# Patient Record
Sex: Female | Born: 1955 | Race: White | Hispanic: No | Marital: Married | Smoking: Never smoker
Health system: Southern US, Community
[De-identification: ages and names within clinical notes are randomized; demographics above are authoritative.]

## PROBLEM LIST (undated history)

## (undated) DIAGNOSIS — F419 Anxiety disorder, unspecified: Secondary | ICD-10-CM

## (undated) DIAGNOSIS — C801 Malignant (primary) neoplasm, unspecified: Secondary | ICD-10-CM

## (undated) DIAGNOSIS — E785 Hyperlipidemia, unspecified: Secondary | ICD-10-CM

## (undated) DIAGNOSIS — E559 Vitamin D deficiency, unspecified: Secondary | ICD-10-CM

## (undated) DIAGNOSIS — E039 Hypothyroidism, unspecified: Secondary | ICD-10-CM

## (undated) DIAGNOSIS — F32A Depression, unspecified: Secondary | ICD-10-CM

## (undated) HISTORY — DX: Vitamin D deficiency, unspecified: E55.9

## (undated) HISTORY — DX: Hypothyroidism, unspecified: E03.9

## (undated) HISTORY — DX: Depression, unspecified: F32.A

## (undated) HISTORY — DX: Hyperlipidemia, unspecified: E78.5

## (undated) HISTORY — DX: Anxiety disorder, unspecified: F41.9

---

## 1982-10-11 HISTORY — PX: GYNECOLOGIC CRYOSURGERY: SHX857

## 1992-10-11 HISTORY — PX: REDUCTION MAMMAPLASTY: SUR839

## 2002-10-11 HISTORY — PX: LUMBAR DISC SURGERY: SHX700

## 2007-10-12 HISTORY — PX: THYROIDECTOMY: SHX17

## 2008-01-17 ENCOUNTER — Ambulatory Visit: Payer: Self-pay | Admitting: Otolaryngology

## 2008-02-07 ENCOUNTER — Ambulatory Visit: Payer: Self-pay | Admitting: Otolaryngology

## 2008-02-12 ENCOUNTER — Ambulatory Visit: Payer: Self-pay | Admitting: Otolaryngology

## 2008-03-04 ENCOUNTER — Ambulatory Visit: Payer: Self-pay | Admitting: Otolaryngology

## 2008-04-08 ENCOUNTER — Ambulatory Visit: Payer: Self-pay | Admitting: Otolaryngology

## 2008-04-16 ENCOUNTER — Ambulatory Visit: Payer: Self-pay | Admitting: Otolaryngology

## 2009-06-24 ENCOUNTER — Ambulatory Visit: Payer: Self-pay | Admitting: Family Medicine

## 2009-06-30 ENCOUNTER — Ambulatory Visit: Payer: Self-pay | Admitting: Family Medicine

## 2009-07-17 ENCOUNTER — Ambulatory Visit: Payer: Self-pay | Admitting: Surgery

## 2009-10-04 IMAGING — NM NUCLEAR MEDICINE 24 HOUR THYROID UPTAKE
5 series · 9 of 9 positions shown · non-contrast
Comparison: none

REASON FOR EXAM: Thyroid nodule
COMMENTS:

PROCEDURE:     NM  - NM THYROID WB 72 HR [DATE] [DATE]
RESULT:     The patient was given a dose of a 1.4 mCi of A-P2P. Anterior and
posterior whole body images are obtained at 24, 48 and 72 hours following
administration.

[Series 1000: (id) statics · 2.40mm/px · 2 of 2 frames shown (1 of 2)]
[frame 1/2  full-range]
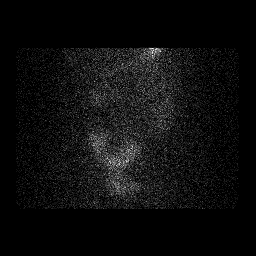
[frame 2/2  full-range]
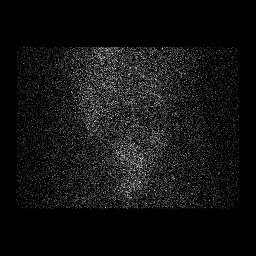

[Series 1000: (id) wb 24 hr · 2.40mm/px · 2 of 2 frames shown]
[frame 1/2  full-range]
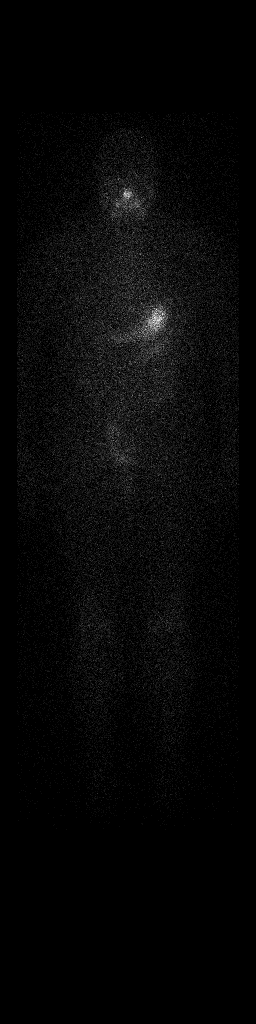
[frame 2/2  full-range]
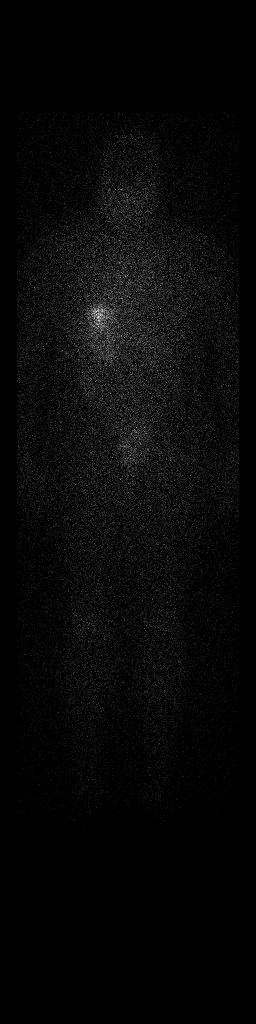

[Series 1000: (id) wb 72 hr · 2.40mm/px · 2 of 2 frames shown]
[frame 1/2]
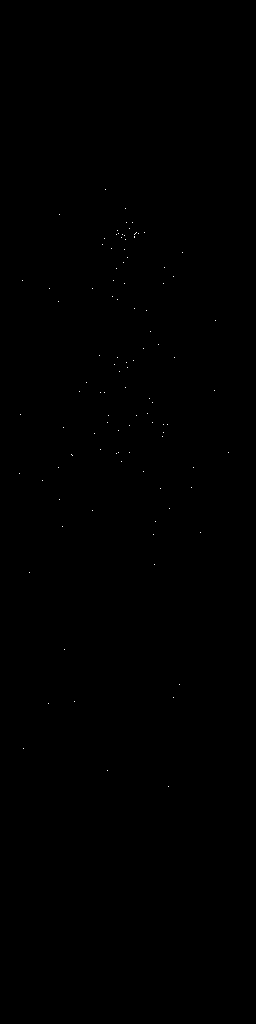
[frame 2/2]
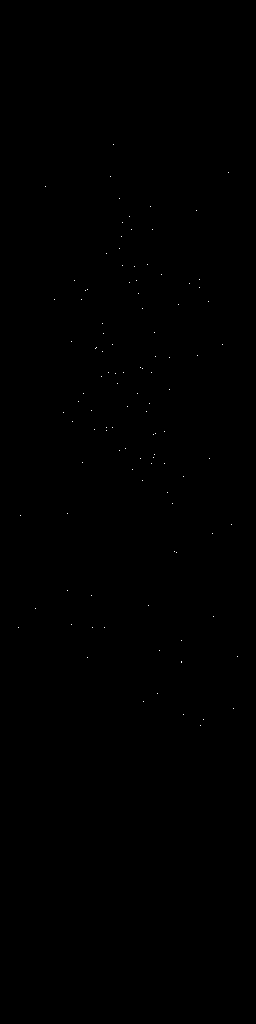

[Series 1000: (id) wb 48hr · 2.40mm/px · 2 of 2 frames shown]
[frame 1/2]
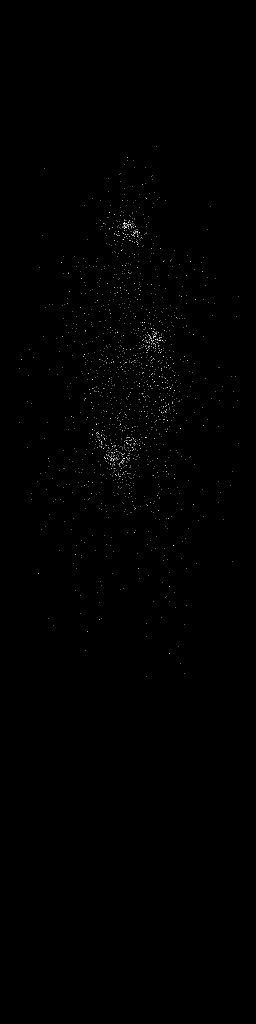
[frame 2/2]
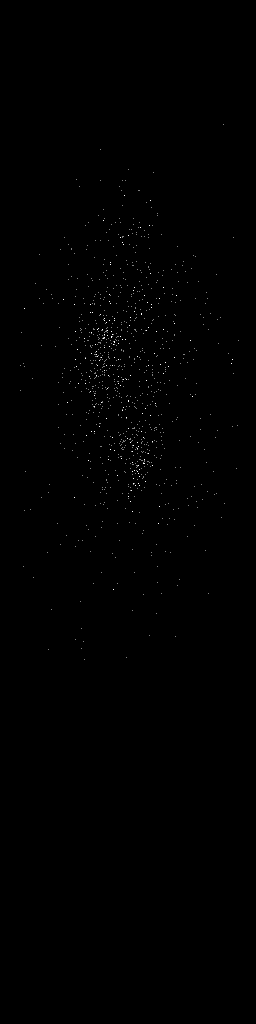

[Series 1000: (id) statics · 2.40mm/px · 1 of 1 slices shown (2 of 2)]
[im 1/1  full-range]
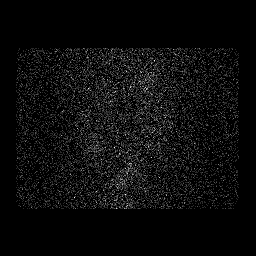

[9 of 9 positions shown; findings below may reference images not displayed]

FINDINGS: There is some minimal residual localization in the cervical region
over the thyroid bed with no significant abnormal localization otherwise.
Normal gastrointestinal and genitourinary activity is seen. There is no
evidence of metastatic disease.
IMPRESSION: Regional uptake in the area of the thyroid bed which may
represent some residual tissue. No definite metastatic disease demonstrated.

## 2009-10-11 HISTORY — PX: UMBILICAL HERNIA REPAIR: SHX196

## 2009-10-22 ENCOUNTER — Ambulatory Visit: Payer: Self-pay | Admitting: Family Medicine

## 2011-01-26 ENCOUNTER — Ambulatory Visit: Payer: Self-pay

## 2011-12-27 ENCOUNTER — Ambulatory Visit: Payer: Self-pay | Admitting: Anesthesiology

## 2011-12-27 LAB — HEMOGLOBIN: HGB: 11.5 g/dL — ABNORMAL LOW (ref 12.0–16.0)

## 2011-12-27 LAB — POTASSIUM: Potassium: 3.4 mmol/L — ABNORMAL LOW (ref 3.5–5.1)

## 2011-12-30 ENCOUNTER — Ambulatory Visit: Payer: Self-pay | Admitting: Surgery

## 2011-12-30 LAB — POTASSIUM: Potassium: 3.7 mmol/L (ref 3.5–5.1)

## 2013-06-04 DIAGNOSIS — E785 Hyperlipidemia, unspecified: Secondary | ICD-10-CM | POA: Insufficient documentation

## 2013-06-04 DIAGNOSIS — C73 Malignant neoplasm of thyroid gland: Secondary | ICD-10-CM | POA: Insufficient documentation

## 2013-06-04 DIAGNOSIS — I10 Essential (primary) hypertension: Secondary | ICD-10-CM | POA: Insufficient documentation

## 2013-06-04 DIAGNOSIS — G4733 Obstructive sleep apnea (adult) (pediatric): Secondary | ICD-10-CM | POA: Insufficient documentation

## 2013-06-04 DIAGNOSIS — F411 Generalized anxiety disorder: Secondary | ICD-10-CM | POA: Insufficient documentation

## 2013-06-04 DIAGNOSIS — F32A Depression, unspecified: Secondary | ICD-10-CM | POA: Insufficient documentation

## 2013-06-04 DIAGNOSIS — J309 Allergic rhinitis, unspecified: Secondary | ICD-10-CM | POA: Insufficient documentation

## 2013-08-01 ENCOUNTER — Ambulatory Visit: Payer: Self-pay

## 2013-10-26 DIAGNOSIS — E559 Vitamin D deficiency, unspecified: Secondary | ICD-10-CM | POA: Insufficient documentation

## 2015-02-02 NOTE — Op Note (Signed)
PATIENT NAME:  Marissa Sullivan, Marissa Sullivan MR#:  [id number] DATE OF BIRTH:  Jul 12, 1956  DATE OF PROCEDURE:  12/30/2011  PREOPERATIVE DIAGNOSIS: Umbilical hernia.   POSTOPERATIVE DIAGNOSIS: Umbilical hernia.   PROCEDURE: Umbilical hernia repair.   SURGEON: Loreli Dollar, MD   ANESTHESIA: General.   INDICATIONS: This 59 year old came in with complaint of bulging at the navel with mild discomfort very slowly enlarging. She had findings of an umbilical hernia in that there was a bulge of approximately 3 cm, was not reducible. Repair was recommended for definitive treatment.   DESCRIPTION OF PROCEDURE: The patient was placed on the operating table in the supine position under general anesthesia. The inner aspect of the umbilicus was cleaned with ChloraPrep with cotton swabs and also the surrounding abdomen cleaned as well and draped in a sterile manner.   There was a palpable mass within the umbilicus which was the hernia. A supraumbilical transversely oriented curvilinear incision was made some 3 cm in length, subsequently lengthened to 4 cm. Dissection was carried down through subcutaneous tissues to encounter a fluid filled sac which was about 3 cm in dimension. This was dissected free from surrounding structures and eventually clear fluid came out of it. It was further dissected down to the fascial ring defect which was a small fascial ring defect of about 5 mm. This sac was dissected free from the skin of the umbilicus and dissected free from the fascial defect. It was amputated with electrocautery. The sac was submitted in formalin for routine pathology. The wound was inspected. A number of small bleeding points were cauterized. Hemostasis was subsequently intact. Next, the fascia was repaired with a 0 Surgilon figure-of-eight suture and then another suture was placed on top of that which was a horizontal mattress suture. Next, the skin of the umbilicus was sutured to the deep fascia with interrupted 4-0  chromic. Next, the skin was closed with running 4-0 chromic subcuticular suture and Dermabond.   The patient tolerated surgery satisfactorily and was then prepared for transfer to the recovery room.   ____________________________ Lenna Sciara. Rochel Brome, MD [email or web address] D: 12/30/2011 08:31:55 ET T: 12/30/2011 08:46:05 ET JOB#: [id number]  cc: Loreli Dollar, MD, <Dictator> Loreli Dollar MD ELECTRONICALLY SIGNED 12/31/2011 19:02

## 2015-02-14 ENCOUNTER — Other Ambulatory Visit: Payer: Self-pay | Admitting: Family Medicine

## 2015-02-14 DIAGNOSIS — Z1239 Encounter for other screening for malignant neoplasm of breast: Secondary | ICD-10-CM

## 2015-09-03 ENCOUNTER — Ambulatory Visit
Admission: RE | Admit: 2015-09-03 | Discharge: 2015-09-03 | Disposition: A | Discharge status: Home / Self Care | Payer: BC Managed Care – PPO | Source: Ambulatory Visit | Attending: Family Medicine | Admitting: Family Medicine

## 2015-09-03 DIAGNOSIS — Z1239 Encounter for other screening for malignant neoplasm of breast: Secondary | ICD-10-CM

## 2015-09-03 DIAGNOSIS — Z1231 Encounter for screening mammogram for malignant neoplasm of breast: Secondary | ICD-10-CM | POA: Diagnosis not present

## 2015-09-03 HISTORY — DX: Malignant (primary) neoplasm, unspecified: C80.1

## 2015-09-09 ENCOUNTER — Other Ambulatory Visit: Payer: Self-pay | Admitting: Family Medicine

## 2015-09-09 DIAGNOSIS — R928 Other abnormal and inconclusive findings on diagnostic imaging of breast: Secondary | ICD-10-CM

## 2015-09-12 ENCOUNTER — Ambulatory Visit
Admission: RE | Admit: 2015-09-12 | Discharge: 2015-09-12 | Disposition: A | Discharge status: Home / Self Care | Payer: BC Managed Care – PPO | Source: Ambulatory Visit | Attending: Family Medicine | Admitting: Family Medicine

## 2015-09-12 DIAGNOSIS — R928 Other abnormal and inconclusive findings on diagnostic imaging of breast: Secondary | ICD-10-CM

## 2015-09-12 DIAGNOSIS — N63 Unspecified lump in breast: Secondary | ICD-10-CM | POA: Insufficient documentation

## 2016-01-13 ENCOUNTER — Other Ambulatory Visit: Payer: Self-pay | Admitting: Family Medicine

## 2016-01-13 DIAGNOSIS — M7989 Other specified soft tissue disorders: Secondary | ICD-10-CM

## 2016-01-19 ENCOUNTER — Ambulatory Visit
Admission: RE | Admit: 2016-01-19 | Discharge: 2016-01-19 | Disposition: A | Discharge status: Home / Self Care | Payer: BC Managed Care – PPO | Source: Ambulatory Visit | Attending: Family Medicine | Admitting: Family Medicine

## 2016-01-19 DIAGNOSIS — R2241 Localized swelling, mass and lump, right lower limb: Secondary | ICD-10-CM | POA: Diagnosis present

## 2016-01-19 DIAGNOSIS — M7989 Other specified soft tissue disorders: Secondary | ICD-10-CM

## 2017-02-11 DIAGNOSIS — Z800 Family history of malignant neoplasm of digestive organs: Secondary | ICD-10-CM | POA: Insufficient documentation

## 2017-02-11 DIAGNOSIS — N6310 Unspecified lump in the right breast, unspecified quadrant: Secondary | ICD-10-CM | POA: Insufficient documentation

## 2017-02-14 ENCOUNTER — Other Ambulatory Visit: Payer: Self-pay | Admitting: Family Medicine

## 2017-02-14 DIAGNOSIS — Z79899 Other long term (current) drug therapy: Secondary | ICD-10-CM

## 2017-02-14 DIAGNOSIS — N6310 Unspecified lump in the right breast, unspecified quadrant: Secondary | ICD-10-CM

## 2017-02-14 DIAGNOSIS — Z780 Asymptomatic menopausal state: Secondary | ICD-10-CM

## 2017-03-15 ENCOUNTER — Other Ambulatory Visit: Payer: Self-pay | Admitting: Family Medicine

## 2017-03-15 DIAGNOSIS — N6310 Unspecified lump in the right breast, unspecified quadrant: Secondary | ICD-10-CM

## 2017-04-22 ENCOUNTER — Ambulatory Visit
Admission: RE | Admit: 2017-04-22 | Discharge: 2017-04-22 | Disposition: A | Discharge status: Home / Self Care | Payer: BC Managed Care – PPO | Source: Ambulatory Visit | Attending: Family Medicine | Admitting: Family Medicine

## 2017-04-22 DIAGNOSIS — N6310 Unspecified lump in the right breast, unspecified quadrant: Secondary | ICD-10-CM | POA: Insufficient documentation

## 2018-05-22 ENCOUNTER — Other Ambulatory Visit: Payer: Self-pay | Admitting: Family Medicine

## 2018-05-22 DIAGNOSIS — Z1231 Encounter for screening mammogram for malignant neoplasm of breast: Secondary | ICD-10-CM

## 2018-05-30 ENCOUNTER — Ambulatory Visit
Admission: RE | Admit: 2018-05-30 | Discharge: 2018-05-30 | Disposition: A | Discharge status: Home / Self Care | Payer: BC Managed Care – PPO | Source: Ambulatory Visit | Attending: Family Medicine | Admitting: Family Medicine

## 2018-05-30 DIAGNOSIS — Z1231 Encounter for screening mammogram for malignant neoplasm of breast: Secondary | ICD-10-CM | POA: Diagnosis not present

## 2019-06-14 ENCOUNTER — Other Ambulatory Visit: Payer: Self-pay | Admitting: Family Medicine

## 2019-06-14 DIAGNOSIS — Z1231 Encounter for screening mammogram for malignant neoplasm of breast: Secondary | ICD-10-CM

## 2019-07-02 ENCOUNTER — Encounter (INDEPENDENT_AMBULATORY_CARE_PROVIDER_SITE_OTHER): Payer: Self-pay

## 2019-07-02 ENCOUNTER — Ambulatory Visit
Admission: RE | Admit: 2019-07-02 | Discharge: 2019-07-02 | Disposition: A | Discharge status: Home / Self Care | Payer: BC Managed Care – PPO | Source: Ambulatory Visit | Attending: Family Medicine | Admitting: Family Medicine

## 2019-07-02 ENCOUNTER — Other Ambulatory Visit: Payer: Self-pay

## 2019-07-02 DIAGNOSIS — Z1231 Encounter for screening mammogram for malignant neoplasm of breast: Secondary | ICD-10-CM | POA: Diagnosis not present

## 2020-06-04 ENCOUNTER — Other Ambulatory Visit: Payer: Self-pay | Admitting: Family Medicine

## 2020-06-04 DIAGNOSIS — Z1231 Encounter for screening mammogram for malignant neoplasm of breast: Secondary | ICD-10-CM

## 2020-07-16 ENCOUNTER — Ambulatory Visit
Admission: RE | Admit: 2020-07-16 | Discharge: 2020-07-16 | Disposition: A | Discharge status: Home / Self Care | Payer: BC Managed Care – PPO | Source: Ambulatory Visit | Attending: Family Medicine | Admitting: Family Medicine

## 2020-07-16 ENCOUNTER — Other Ambulatory Visit: Payer: Self-pay

## 2020-07-16 DIAGNOSIS — Z1231 Encounter for screening mammogram for malignant neoplasm of breast: Secondary | ICD-10-CM | POA: Insufficient documentation

## 2020-07-23 ENCOUNTER — Other Ambulatory Visit: Payer: Self-pay | Admitting: Family Medicine

## 2020-07-23 DIAGNOSIS — R928 Other abnormal and inconclusive findings on diagnostic imaging of breast: Secondary | ICD-10-CM

## 2020-07-23 DIAGNOSIS — R921 Mammographic calcification found on diagnostic imaging of breast: Secondary | ICD-10-CM

## 2020-07-30 ENCOUNTER — Ambulatory Visit
Admission: RE | Admit: 2020-07-30 | Discharge: 2020-07-30 | Disposition: A | Discharge status: Home / Self Care | Payer: BC Managed Care – PPO | Source: Ambulatory Visit | Attending: Family Medicine | Admitting: Family Medicine

## 2020-07-30 ENCOUNTER — Other Ambulatory Visit: Payer: Self-pay

## 2020-07-30 DIAGNOSIS — R928 Other abnormal and inconclusive findings on diagnostic imaging of breast: Secondary | ICD-10-CM

## 2020-07-30 DIAGNOSIS — R921 Mammographic calcification found on diagnostic imaging of breast: Secondary | ICD-10-CM | POA: Insufficient documentation

## 2021-07-27 ENCOUNTER — Other Ambulatory Visit: Payer: Self-pay | Admitting: Family Medicine

## 2021-07-27 DIAGNOSIS — R928 Other abnormal and inconclusive findings on diagnostic imaging of breast: Secondary | ICD-10-CM

## 2021-08-13 ENCOUNTER — Other Ambulatory Visit: Payer: Self-pay

## 2021-08-13 ENCOUNTER — Ambulatory Visit
Admission: RE | Admit: 2021-08-13 | Discharge: 2021-08-13 | Disposition: A | Discharge status: Home / Self Care | Payer: BC Managed Care – PPO | Source: Ambulatory Visit | Attending: Family Medicine | Admitting: Family Medicine

## 2021-08-13 DIAGNOSIS — R928 Other abnormal and inconclusive findings on diagnostic imaging of breast: Secondary | ICD-10-CM | POA: Insufficient documentation

## 2021-12-26 DIAGNOSIS — N3946 Mixed incontinence: Secondary | ICD-10-CM | POA: Insufficient documentation

## 2022-07-07 ENCOUNTER — Other Ambulatory Visit: Payer: Self-pay | Admitting: Family Medicine

## 2022-07-07 DIAGNOSIS — Z1231 Encounter for screening mammogram for malignant neoplasm of breast: Secondary | ICD-10-CM

## 2022-07-07 DIAGNOSIS — R921 Mammographic calcification found on diagnostic imaging of breast: Secondary | ICD-10-CM

## 2022-08-12 ENCOUNTER — Ambulatory Visit
Admission: RE | Admit: 2022-08-12 | Discharge: 2022-08-12 | Disposition: A | Discharge status: Home / Self Care | Payer: BC Managed Care – PPO | Source: Ambulatory Visit | Attending: Family Medicine | Admitting: Family Medicine

## 2022-08-12 DIAGNOSIS — R921 Mammographic calcification found on diagnostic imaging of breast: Secondary | ICD-10-CM

## 2022-08-12 DIAGNOSIS — Z1231 Encounter for screening mammogram for malignant neoplasm of breast: Secondary | ICD-10-CM | POA: Insufficient documentation

## 2023-02-15 ENCOUNTER — Encounter: Payer: Self-pay | Admitting: General Practice

## 2023-03-25 ENCOUNTER — Ambulatory Visit
Admission: RE | Admit: 2023-03-25 | Discharge: 2023-03-25 | Disposition: A | Discharge status: Home / Self Care | Payer: Medicare PPO | Source: Ambulatory Visit | Attending: Urology | Admitting: Urology

## 2023-03-25 ENCOUNTER — Other Ambulatory Visit: Payer: Self-pay

## 2023-03-25 ENCOUNTER — Encounter: Payer: Self-pay | Admitting: Urology

## 2023-03-25 ENCOUNTER — Ambulatory Visit: Payer: Medicare PPO | Admitting: Urology

## 2023-03-25 ENCOUNTER — Other Ambulatory Visit
Admission: RE | Admit: 2023-03-25 | Discharge: 2023-03-25 | Disposition: A | Discharge status: Home / Self Care | Payer: Medicare PPO | Source: Home / Self Care | Attending: Urology | Admitting: Urology

## 2023-03-25 VITALS — BP 126/74 | HR 62 | Ht 65.0 in | Wt 151.0 lb

## 2023-03-25 DIAGNOSIS — N134 Hydroureter: Secondary | ICD-10-CM | POA: Diagnosis not present

## 2023-03-25 DIAGNOSIS — N3946 Mixed incontinence: Secondary | ICD-10-CM | POA: Insufficient documentation

## 2023-03-25 DIAGNOSIS — N39490 Overflow incontinence: Secondary | ICD-10-CM

## 2023-03-25 DIAGNOSIS — R339 Retention of urine, unspecified: Secondary | ICD-10-CM | POA: Insufficient documentation

## 2023-03-25 DIAGNOSIS — N281 Cyst of kidney, acquired: Secondary | ICD-10-CM | POA: Insufficient documentation

## 2023-03-25 DIAGNOSIS — R32 Unspecified urinary incontinence: Secondary | ICD-10-CM | POA: Diagnosis not present

## 2023-03-25 DIAGNOSIS — N200 Calculus of kidney: Secondary | ICD-10-CM | POA: Diagnosis not present

## 2023-03-25 LAB — URINALYSIS, COMPLETE (UACMP) WITH MICROSCOPIC
Bacteria, UA: NONE SEEN
Bilirubin Urine: NEGATIVE
Glucose, UA: NEGATIVE mg/dL
Hgb urine dipstick: NEGATIVE
Ketones, ur: NEGATIVE mg/dL
Leukocytes,Ua: NEGATIVE
Nitrite: NEGATIVE
Protein, ur: NEGATIVE mg/dL
RBC / HPF: NONE SEEN RBC/hpf (ref 0–5)
Specific Gravity, Urine: 1.01 (ref 1.005–1.030)
WBC, UA: NONE SEEN WBC/hpf (ref 0–5)
pH: 7 (ref 5.0–8.0)

## 2023-03-25 LAB — BASIC METABOLIC PANEL
Anion gap: 9 (ref 5–15)
BUN: 15 mg/dL (ref 8–23)
CO2: 28 mmol/L (ref 22–32)
Calcium: 9.7 mg/dL (ref 8.9–10.3)
Chloride: 102 mmol/L (ref 98–111)
Creatinine, Ser: 0.64 mg/dL (ref 0.44–1.00)
GFR, Estimated: >60 mL/min (ref >60)
Glucose, Bld: 99 mg/dL (ref 70–99)
Potassium: 3.8 mmol/L (ref 3.5–5.1)
Sodium: 139 mmol/L (ref 135–145)

## 2023-03-25 LAB — BLADDER SCAN AMB NON-IMAGING: Scan Result: 795

## 2023-03-25 NOTE — Progress Notes (Signed)
Marissa Sullivan,acting as a scribe for Marissa Scotland, MD.,have documented all relevant documentation on the behalf of Marissa Scotland, MD,as directed by  Marissa Scotland, MD while in the presence of Marissa Scotland, MD.  03/25/2023 10:14 AM   Marissa Sullivan January 30, 1956 [id number]  Referring provider: Oletha Blend, MD [address] [facility] [place],  [place] [address]  Chief Complaint  Patient presents with   Establish Care   Urinary Incontinence    HPI: 67 year-old female who is referred for urinary incontinence.   She reports experiencing urinary leakage for approximately one year. Her PCP placed her on oxybutynin around that time. The leakage occurs primarily when she is up and moving around, such as when she stands up, bends down, or changes positions. She describes the leakage as sometimes being a constant dribble and other times just a little bit. She notes that the issue is more pronounced in the late afternoons and evenings. She does not experience urgency or the need to rush to the bathroom, and she can hold her urine for extended periods, especially when traveling. She does not have any issues with nighttime incontinence and sleeps through the night without problems.   She denies any constipation or bowel issues. She also denies any leg numbness or weakness. She has not had any surgeries involving the uterus and had her last menstrual period at age 59.   Results for orders placed or performed in visit on 03/25/23  Bladder Scan (Post Void Residual) in office  Result Value Ref Range   Scan Result 795 ml   Results for orders placed or performed during the hospital encounter of 03/25/23  Urinalysis, Complete w Microscopic -  Result Value Ref Range   Color, Urine YELLOW YELLOW   APPearance CLEAR CLEAR   Specific Gravity, Urine 1.010 1.005 - 1.030   pH 7.0 5.0 - 8.0   Glucose, UA NEGATIVE NEGATIVE mg/dL   Hgb urine dipstick NEGATIVE NEGATIVE    Bilirubin Urine NEGATIVE NEGATIVE   Ketones, ur NEGATIVE NEGATIVE mg/dL   Protein, ur NEGATIVE NEGATIVE mg/dL   Nitrite NEGATIVE NEGATIVE   Leukocytes,Ua NEGATIVE NEGATIVE   Squamous Epithelial / HPF 0-5 0 - 5 /HPF   WBC, UA NONE SEEN 0 - 5 WBC/hpf   RBC / HPF NONE SEEN 0 - 5 RBC/hpf   Bacteria, UA NONE SEEN NONE SEEN      PMH: Past Medical History:  Diagnosis Date   Anxiety    Cancer (HCC)    thyroid ca   Depression    Hyperlipidemia    Vitamin D deficiency     Surgical History: Past Surgical History:  Procedure Laterality Date   REDUCTION MAMMAPLASTY Bilateral 1994    Home Medications:  Allergies as of 03/25/2023       Reactions   Amoxicillin-pot Clavulanate Other (See Comments)   Got yeast infection in the past  With this medication - not a true allergy.  Got yeast infection in the past  With this medication - not a true allergy.        Medication List        Accurate as of March 25, 2023 10:14 AM. If you have any questions, ask your nurse or doctor.          STOP taking these medications    oxybutynin 10 MG 24 hr tablet Commonly known as: DITROPAN-XL Stopped by: Marissa Scotland, MD       TAKE these medications  amLODipine 2.5 MG tablet Commonly known as: NORVASC Take 2.5 mg by mouth daily.   atorvastatin 20 MG tablet Commonly known as: LIPITOR Take 20 mg by mouth daily.   levocetirizine 5 MG tablet Commonly known as: XYZAL Take 5 mg by mouth every evening.   levothyroxine 112 MCG tablet Commonly known as: SYNTHROID Take 112 mcg by mouth daily.        Allergies:  Allergies  Allergen Reactions   Amoxicillin-Pot Clavulanate Other (See Comments)    Got yeast infection in the past  With this medication - not a true allergy.   Got yeast infection in the past  With this medication - not a true allergy.    Family History: Family History  Problem Relation Age of Onset   Breast cancer Paternal Grandmother     Social History:   reports that she has never smoked. She has never used smokeless tobacco. She reports current alcohol use. She reports that she does not use drugs.   Physical Exam: BP 126/74   Pulse 62   Ht 5\' 5"  (1.651 m)   Wt 151 lb (68.5 kg)   BMI 25.13 kg/m   Constitutional:  Alert and oriented, No acute distress. HEENT: Marissa Sullivan AT, moist mucus membranes.  Trachea midline, no masses. Neurologic: Grossly intact, no focal deficits, moving all 4 extremities. Psychiatric: Normal mood and affect.  Assessment & Plan:    1. Chronic urinary retention/ overflow incontinence - Etiology of her retention is unclear. She is minimally symptomatic other than the incontinence portion. She has no neurological symptoms associated with this.  -Urinalysis today is negative, no evidence of infection -No previous labs and diagnoses suggestive of etiology -Based on her lack of discomfort today with very large bladder volume, suspect this is relatively chronic issue - Plan for a renal/bladder ultrasound as well as a BMP today to test her upper tract and renal function.  - Emergent urinary decompression is likely not warranted at this time unless her labs are abnormal - We can manage this more conservatively with CIC teaching early next week.  - Ultimately, we have to try to further elucidate the cause. We will start with the above and then follow up accordingly.   Return for STAT renal/bladder ultrasound and BMP labs.  I have reviewed the above documentation for accuracy and completeness, and I agree with the above.   Marissa Scotland, MD    [facility] [address], [address] [place], [place] [address] [phone]  I spent 46 total minutes on the day of the encounter including pre-visit review of the medical record, face-to-face time with the patient, and post visit ordering of labs/imaging/tests.

## 2023-03-25 NOTE — Progress Notes (Unsigned)
03/28/2023 9:28 PM   Marissa Sullivan October 24, 1955 [id number]  Referring provider: Oletha Blend, MD [address] [facility] [place],  [place] [address]  Urological history: 1. Urinary retention -BMP (03/2023) - WNL  -RUS (03/2023) - ? Of large bladder diverticulum w/ left hydroureter  No chief complaint on file.   HPI: Marissa Sullivan is a 67 y.o. female who presents today for for instruction on CIC.  Previous records reviewed.   She was seen on 03/25/2023 as a new patient for urinary incontinence.  PVR was found to be 795 mL.  She was asymptomatic and BMP and UA were normal.   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    PMH: Past Medical History:  Diagnosis Date   Anxiety    Cancer (HCC)    thyroid ca   Depression    Hyperlipidemia    Vitamin D deficiency     Surgical History: Past Surgical History:  Procedure Laterality Date   REDUCTION MAMMAPLASTY Bilateral 1994    Home Medications:  Allergies as of 03/28/2023       Reactions   Amoxicillin-pot Clavulanate Other (See Comments)   Got yeast infection in the past  With this medication - not a true allergy.  Got yeast infection in the past  With this medication - not a true allergy.        Medication List        Accurate as of March 25, 2023  9:28 PM. If you have any questions, ask your nurse or doctor.          amLODipine 2.5 MG tablet Commonly known as: NORVASC Take 2.5 mg by mouth daily.   atorvastatin 20 MG tablet Commonly known as: LIPITOR Take 20 mg by mouth daily.   levocetirizine 5 MG tablet Commonly known as: XYZAL Take 5 mg by mouth every evening.   levothyroxine 112 MCG tablet Commonly known as: SYNTHROID Take 112 mcg by mouth daily.        Allergies:  Allergies  Allergen Reactions   Amoxicillin-Pot Clavulanate Other (See  Comments)    Got yeast infection in the past  With this medication - not a true allergy.   Got yeast infection in the past  With this medication - not a true allergy.    Family History: Family History  Problem Relation Age of Onset   Breast cancer Paternal Grandmother     Social History:  reports that she has never smoked. She has never used smokeless tobacco. She reports current alcohol use. She reports that she does not use drugs.  ROS: Pertinent ROS in HPI  Physical Exam: There were no vitals taken for this visit.  Constitutional:  Well nourished. Alert and oriented, No acute distress. HEENT: [place] AT, moist mucus membranes.  Trachea midline, no masses. Cardiovascular: No clubbing, cyanosis, or edema. Respiratory: Normal respiratory effort, no increased work of breathing. GU: No CVA tenderness.  No bladder fullness or masses. Vulvovaginal atrophy w/ pallor, loss of rugae, introital retraction, excoriations.  Vulvar thinning, fusion of labia, clitoral hood retraction, prominent urethral meatus.   *** external genitalia, *** pubic hair distribution, no lesions.  Normal urethral meatus, no lesions, no prolapse, no discharge.   No urethral masses, tenderness and/or tenderness. No bladder fullness, tenderness or masses. *** vagina mucosa, *** estrogen effect, no discharge, no lesions, *** pelvic support, *** cystocele  and *** rectocele noted.  No cervical motion tenderness.  Uterus is freely mobile and non-fixed.  No adnexal/parametria masses or tenderness noted.  Anus and perineum are without rashes or lesions.   ***  Neurologic: Grossly intact, no focal deficits, moving all 4 extremities. Psychiatric: Normal mood and affect.    Laboratory Data: Lab Results  Component Value Date   CREATININE 0.64 03/25/2023   Urinalysis    Component Value Date/Time   COLORURINE YELLOW 03/25/2023 0908   APPEARANCEUR CLEAR 03/25/2023 0908   LABSPEC 1.010 03/25/2023 0908   PHURINE 7.0 03/25/2023 0908    GLUCOSEU NEGATIVE 03/25/2023 0908   HGBUR NEGATIVE 03/25/2023 0908   BILIRUBINUR NEGATIVE 03/25/2023 0908   KETONESUR NEGATIVE 03/25/2023 0908   PROTEINUR NEGATIVE 03/25/2023 0908   NITRITE NEGATIVE 03/25/2023 0908   LEUKOCYTESUR NEGATIVE 03/25/2023 0908  I have reviewed the labs.   Pertinent Imaging: CLINICAL DATA:  Urinary retention, overflow incontinence.   EXAM: RENAL / URINARY TRACT ULTRASOUND COMPLETE   COMPARISON:  CT abdomen pelvis dated 07/17/2009.   FINDINGS: Right Kidney:   Renal measurements: 10.5 x 6.1 x 5.2 cm = volume: 175 mL. Echogenicity within normal limits. A nonobstructive renal stone in the lower pole measures 6 mm. No mass or hydronephrosis visualized.   Left Kidney:   Renal measurements: 11.8 x 5.3 x 4.6 cm = volume: 150 mL. Echogenicity within normal limits. A cyst in the upper pole measures 4.7 x 5.1 x 4.3 cm. No imaging follow-up is recommended for this finding. No solid mass visualized. Hydroureter is noted, measuring up to 1.7 cm in diameter.   Bladder:   Appears normal for degree of bladder distention. Prevoid volume was 53 mL and postvoid volume was 10 mL. Ureteral jets were noted.   Other:   A multiloculated cystic mass measuring 17.9 x 10.2 x 11.5 cm is seen in the anterior abdomen superior to the bladder. This appears separate from the bladder, however a complex diverticulum arising from the urinary bladder is difficult to exclude.   IMPRESSION: 1. Multiloculated cystic mass in the anterior abdomen superior to the bladder. This appears separate from the bladder, however a complex diverticulum arising from the urinary bladder is difficult to exclude. CT abdomen pelvis with contrast may be helpful for further characterization. 2. Left hydroureter. 3. Nonobstructive right renal stone.     Electronically Signed   By: Romona Curls M.D.   On: 03/25/2023 16:06 I have independently reviewed the films.    Assessment & Plan:     1. 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  These notes generated with voice recognition software. I apologize for typographical errors.  Cloretta Ned  [facility] [address]  [address] [place], [place] [address] [phone]

## 2023-03-28 ENCOUNTER — Encounter: Payer: Self-pay | Admitting: Urology

## 2023-03-28 ENCOUNTER — Ambulatory Visit: Payer: Medicare PPO | Admitting: Urology

## 2023-03-28 VITALS — BP 106/65 | HR 66 | Ht 65.0 in | Wt 151.0 lb

## 2023-03-28 DIAGNOSIS — N1339 Other hydronephrosis: Secondary | ICD-10-CM

## 2023-03-28 DIAGNOSIS — R1900 Intra-abdominal and pelvic swelling, mass and lump, unspecified site: Secondary | ICD-10-CM

## 2023-03-28 DIAGNOSIS — N134 Hydroureter: Secondary | ICD-10-CM

## 2023-03-28 DIAGNOSIS — N3281 Overactive bladder: Secondary | ICD-10-CM | POA: Diagnosis not present

## 2023-03-28 DIAGNOSIS — R339 Retention of urine, unspecified: Secondary | ICD-10-CM

## 2023-03-28 MED ORDER — MIRABEGRON ER 25 MG PO TB24: 25.0000 mg | ORAL_TABLET | Freq: Every day | ORAL | 0 refills | Status: DC

## 2023-04-07 ENCOUNTER — Ambulatory Visit
Admission: RE | Admit: 2023-04-07 | Discharge: 2023-04-07 | Disposition: A | Discharge status: Home / Self Care | Payer: Medicare PPO | Source: Ambulatory Visit | Attending: Urology | Admitting: Urology

## 2023-04-07 DIAGNOSIS — N1339 Other hydronephrosis: Secondary | ICD-10-CM | POA: Insufficient documentation

## 2023-04-07 DIAGNOSIS — R1900 Intra-abdominal and pelvic swelling, mass and lump, unspecified site: Secondary | ICD-10-CM | POA: Diagnosis present

## 2023-04-07 MED ORDER — IOHEXOL 300 MG/ML  SOLN
100.0000 mL | Freq: Once | INTRAMUSCULAR | Status: AC | PRN
  Administered 2023-04-07: 100 mL via INTRAVENOUS

## 2023-04-15 ENCOUNTER — Ambulatory Visit: Payer: Medicare PPO | Admitting: Urology

## 2023-04-15 VITALS — BP 129/74 | HR 76 | Ht 65.0 in | Wt 151.0 lb

## 2023-04-15 DIAGNOSIS — N3941 Urge incontinence: Secondary | ICD-10-CM

## 2023-04-15 DIAGNOSIS — R1900 Intra-abdominal and pelvic swelling, mass and lump, unspecified site: Secondary | ICD-10-CM | POA: Diagnosis not present

## 2023-04-15 DIAGNOSIS — N1339 Other hydronephrosis: Secondary | ICD-10-CM

## 2023-04-15 DIAGNOSIS — N3281 Overactive bladder: Secondary | ICD-10-CM

## 2023-04-15 MED ORDER — MIRABEGRON ER 50 MG PO TB24
50.00 mg | ORAL_TABLET | Freq: Every day | ORAL | 11 refills | Status: AC
Start: 2023-04-15 — End: ?

## 2023-04-18 ENCOUNTER — Inpatient Hospital Stay: Payer: Medicare PPO | Attending: Obstetrics and Gynecology

## 2023-04-18 ENCOUNTER — Other Ambulatory Visit: Payer: Self-pay | Admitting: Nurse Practitioner

## 2023-04-18 DIAGNOSIS — R339 Retention of urine, unspecified: Secondary | ICD-10-CM | POA: Diagnosis not present

## 2023-04-18 DIAGNOSIS — N1330 Unspecified hydronephrosis: Secondary | ICD-10-CM | POA: Insufficient documentation

## 2023-04-18 DIAGNOSIS — R1900 Intra-abdominal and pelvic swelling, mass and lump, unspecified site: Secondary | ICD-10-CM | POA: Insufficient documentation

## 2023-04-18 DIAGNOSIS — R32 Unspecified urinary incontinence: Secondary | ICD-10-CM | POA: Diagnosis not present

## 2023-04-18 DIAGNOSIS — I10 Essential (primary) hypertension: Secondary | ICD-10-CM | POA: Diagnosis not present

## 2023-04-18 DIAGNOSIS — Z803 Family history of malignant neoplasm of breast: Secondary | ICD-10-CM | POA: Insufficient documentation

## 2023-04-18 DIAGNOSIS — R971 Elevated cancer antigen 125 [CA 125]: Secondary | ICD-10-CM | POA: Insufficient documentation

## 2023-04-18 DIAGNOSIS — Z801 Family history of malignant neoplasm of trachea, bronchus and lung: Secondary | ICD-10-CM | POA: Insufficient documentation

## 2023-04-18 DIAGNOSIS — E785 Hyperlipidemia, unspecified: Secondary | ICD-10-CM | POA: Insufficient documentation

## 2023-04-18 DIAGNOSIS — E039 Hypothyroidism, unspecified: Secondary | ICD-10-CM | POA: Insufficient documentation

## 2023-04-18 DIAGNOSIS — Z85850 Personal history of malignant neoplasm of thyroid: Secondary | ICD-10-CM | POA: Insufficient documentation

## 2023-04-18 NOTE — Progress Notes (Signed)
I, Chaya Jan Maxie,acting as a scribe for Vanna Scotland, MD.,have documented all relevant documentation on the behalf of Vanna Scotland, MD,as directed by  Vanna Scotland, MD while in the presence of Vanna Scotland, MD.   04/15/23 6:33 PM   Marissa Sullivan 05-18-1956 [id number]  Referring provider: Oletha Blend, MD [address] [facility] [place],  [place] [address]  Chief Complaint  Patient presents with   Results    HPI: 67 year-old female who initially presented with concerns for overflow incontinence based on bladder scan, ultimately found to have a cystic pelvic mass.   In the interim, she underwent CT abdomen pelvis with contrast, which I personally reviewed today, that shows a large 11.6 x 11.9 centimeter cystic pelvic mass, located centrally, likely arising from the adnexa with intermediate attenuation. 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   She was given Myrbetriq samples by Carollee Herter, previously failed Oxybutynin.   She notes improvement with Myrbetriq but still experiences occasional leakage if she does not go to the bathroom immediately.   PMH: Past Medical History:  Diagnosis Date   Anxiety    Cancer (HCC)    thyroid ca   Depression    Hyperlipidemia    Vitamin D deficiency     Surgical History: Past Surgical History:  Procedure Laterality Date   REDUCTION MAMMAPLASTY Bilateral 1994    Home Medications:  Allergies as of 04/15/2023       Reactions   Amoxicillin-pot Clavulanate Other (See Comments)   Got yeast infection in the past  With this medication - not a true allergy.  Got yeast infection in the past  With this medication - not a true allergy.        Medication List        Accurate as of April 15, 2023 11:59 PM. If you have any questions, ask your nurse or doctor.          amLODipine 2.5 MG tablet Commonly known as: NORVASC Take 2.5 mg by mouth daily.   atorvastatin 20 MG tablet Commonly known  as: LIPITOR Take 20 mg by mouth daily.   levocetirizine 5 MG tablet Commonly known as: XYZAL Take 5 mg by mouth every evening.   levothyroxine 112 MCG tablet Commonly known as: SYNTHROID Take 112 mcg by mouth daily.   mirabegron ER 50 MG Tb24 tablet Commonly known as: MYRBETRIQ Take 1 tablet (50 mg total) by mouth daily. What changed:  medication strength how much to take        Allergies:  Allergies  Allergen Reactions   Amoxicillin-Pot Clavulanate Other (See Comments)    Got yeast infection in the past  With this medication - not a true allergy.   Got yeast infection in the past  With this medication - not a true allergy.    Family History: Family History  Problem Relation Age of Onset   Breast cancer Paternal Grandmother     Social History:  reports that she has never smoked. She has never used smokeless tobacco. She reports current alcohol use. She reports that she does not use drugs.   Physical Exam: BP 129/74   Pulse 76   Ht 5\' 5"  (1.651 m)   Wt 151 lb (68.5 kg)   BMI 25.13 kg/m   Constitutional:  Alert and oriented, No acute distress. HEENT: [place] AT, moist mucus membranes.  Trachea midline, no masses. Neurologic: Grossly intact, no focal deficits, moving all 4 extremities. Psychiatric: Normal mood and affect.  Pertinent Imaging:  CLINICAL DATA:  Multiloculated cystic mass, hydronephrosis.   EXAM: CT ABDOMEN AND PELVIS WITH CONTRAST   TECHNIQUE: Multidetector CT imaging of the abdomen and pelvis was performed using the standard protocol following bolus administration of intravenous contrast.   RADIATION DOSE REDUCTION: This exam was performed according to the departmental dose-optimization program which includes automated exposure control, adjustment of the mA and/or kV according to patient size and/or use of iterative reconstruction technique.   CONTRAST:  OMNIPAQUE IOHEXOL 300 MG/ML  SOLN   COMPARISON:  Renal ultrasound 03/25/2023 and  CT abdomen pelvis 07/17/2009.   FINDINGS: Lower chest: No acute findings. Heart is enlarged. No pericardial or pleural effusion. Distal esophagus is grossly unremarkable.   Hepatobiliary: Liver and gallbladder are unremarkable. No biliary ductal dilatation.   Pancreas: Negative.   Spleen: Negative.   Adrenals/Urinary Tract: Adrenal glands and right kidney are unremarkable. 5.6 cm left renal cyst. No follow-up necessary. Mild prominence of the right ureter. Bladder is grossly unremarkable.   Stomach/Bowel: Stomach, small bowel, appendix and colon are unremarkable.   Vascular/Lymphatic: Atherosclerotic calcification of the aorta. No pathologically enlarged lymph nodes.   Reproductive: Uterus is visualized. Bilateral cystic adnexal masses measure up to 11.6 x 11.9 cm centrally, with moderately increased attenuation, 46 Hounsfield units (2/72).   Other: No free fluid.  Mesenteries and peritoneum are unremarkable.   Musculoskeletal: Degenerative changes in the spine. Slightly striated lucent lesion in the left lateral aspect the T12 vertebral body was present on 07/17/2009 but is larger.   IMPRESSION: 1. Bilateral cystic adnexal masses with the largest having intermediate attenuation, suggesting blood products. 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 Recommend GYN consult, and consider pelvis MRI w/o and w/ contrast if clinically warranted. Note: This recommendation does not apply to premenarchal patients and to those with increased risk (genetic, family history, elevated tumor markers or other high-risk factors) of ovarian cancer. Reference: JACR 2020 Feb; 17(2):248-254. 2. There may be minimal right hydronephrosis in association. 3. Possible enlarging hemangioma in the T12 vertebral body. Please correlate clinically as metastatic disease cannot be definitively excluded. 4.  Aortic atherosclerosis (ICD10-I70.0).     Electronically Signed   By: Leanna Battles  M.D.   On: 04/11/2023 13:42  Personally reviewed and agree with radiologic interpretation.  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 She has an extrarenal pelvis versus low-grade UPJ obstruction on the left. It's a minimal fullness on the right, non-concerning.  -Her renal function is normal. -Would not recommend any further evaluation or treatment for this.  Urinary incontinence -Primarily urge incontinence.  -Continue Myrbetriq, increase dose to 50 mg.  Return in about 6 months (around 10/16/2023) for reassess bladder symptoms.  I have reviewed the above documentation for accuracy and completeness, and I agree with the above.   Vanna Scotland, MD  [facility] [address], [address] [place], [place] [address] [phone]

## 2023-04-19 LAB — CA 125: Cancer Antigen (CA) 125: 85.8 U/mL — ABNORMAL HIGH (ref 0.0–38.1)

## 2023-04-19 LAB — HUMAN EPIDIDYMIS PROT 4,SERIAL: HE4: 64.7 pmol/L (ref 0.0–96.5)

## 2023-04-20 ENCOUNTER — Ambulatory Visit
Admission: RE | Admit: 2023-04-20 | Discharge: 2023-04-20 | Disposition: A | Discharge status: Home / Self Care | Payer: Medicare PPO | Source: Ambulatory Visit | Attending: Nurse Practitioner | Admitting: Nurse Practitioner

## 2023-04-20 ENCOUNTER — Inpatient Hospital Stay: Payer: Medicare PPO | Admitting: Obstetrics and Gynecology

## 2023-04-20 ENCOUNTER — Encounter: Payer: Self-pay | Admitting: Obstetrics and Gynecology

## 2023-04-20 ENCOUNTER — Inpatient Hospital Stay: Payer: Medicare PPO

## 2023-04-20 ENCOUNTER — Other Ambulatory Visit: Payer: Self-pay

## 2023-04-20 VITALS — BP 125/86 | HR 67 | Temp 96.6°F | Resp 18 | Wt 153.0 lb

## 2023-04-20 DIAGNOSIS — R1900 Intra-abdominal and pelvic swelling, mass and lump, unspecified site: Secondary | ICD-10-CM

## 2023-04-20 DIAGNOSIS — N1330 Unspecified hydronephrosis: Secondary | ICD-10-CM | POA: Diagnosis not present

## 2023-04-20 DIAGNOSIS — Z01810 Encounter for preprocedural cardiovascular examination: Secondary | ICD-10-CM

## 2023-04-20 LAB — CBC WITH DIFFERENTIAL/PLATELET
Abs Immature Granulocytes: 0.03 10*3/uL (ref 0.00–0.07)
Basophils Absolute: 0 10*3/uL (ref 0.0–0.1)
Basophils Relative: 1 %
Eosinophils Absolute: 0.1 10*3/uL (ref 0.0–0.5)
Eosinophils Relative: 3 %
HCT: 41.8 % (ref 36.0–46.0)
Hemoglobin: 13.9 g/dL (ref 12.0–15.0)
Immature Granulocytes: 1 %
Lymphocytes Relative: 38 %
Lymphs Abs: 1.5 10*3/uL (ref 0.7–4.0)
MCH: 29.3 pg (ref 26.0–34.0)
MCHC: 33.3 g/dL (ref 30.0–36.0)
MCV: 88.2 fL (ref 80.0–100.0)
Monocytes Absolute: 0.3 10*3/uL (ref 0.1–1.0)
Monocytes Relative: 7 %
Neutro Abs: 2 10*3/uL (ref 1.7–7.7)
Neutrophils Relative %: 50 %
Platelets: 207 10*3/uL (ref 150–400)
RBC: 4.74 MIL/uL (ref 3.87–5.11)
RDW: 13.9 % (ref 11.5–15.5)
WBC: 4 10*3/uL (ref 4.0–10.5)
nRBC: 0 % (ref 0.0–0.2)

## 2023-04-20 LAB — COMPREHENSIVE METABOLIC PANEL
ALT: 24 U/L (ref 0–44)
AST: 19 U/L (ref 15–41)
Albumin: 4.7 g/dL (ref 3.5–5.0)
Alkaline Phosphatase: 66 U/L (ref 38–126)
Anion gap: 10 (ref 5–15)
BUN: 12 mg/dL (ref 8–23)
CO2: 30 mmol/L (ref 22–32)
Calcium: 10.2 mg/dL (ref 8.9–10.3)
Chloride: 102 mmol/L (ref 98–111)
Creatinine, Ser: 0.72 mg/dL (ref 0.44–1.00)
GFR, Estimated: >60 mL/min (ref >60)
Glucose, Bld: 96 mg/dL (ref 70–99)
Potassium: 3.8 mmol/L (ref 3.5–5.1)
Sodium: 142 mmol/L (ref 135–145)
Total Bilirubin: 0.7 mg/dL (ref 0.3–1.2)
Total Protein: 7.6 g/dL (ref 6.5–8.1)

## 2023-04-20 LAB — TSH: TSH: 1.735 u[IU]/mL (ref 0.350–4.500)

## 2023-04-20 NOTE — Progress Notes (Signed)
Gynecologic Oncology Consult Visit   Referring Provider: Dr Apolinar Junes  Chief Concern: pelvic mass  Subjective:  Marissa Sullivan is a 67 y.o. female G1P1 with a history of thyroid cancer s/p thyroidectomy who is seen in consultation from Dr. Apolinar Junes for symptomatic pelvic mass with left-sided hydronephrosis.   Patient was complaining of urinary retention and overflow incontinence which prompted a renal ultrasound. She was noted to have multiloculated cystic mass in the anterior abdomen superior to bladder measuring 17.9 x 10.2 x 11.5 cm and left hydroureter 1.7 cm. She also has a renal cyst in the left upper pole measuring 4.7 x 5.1 x 4.3 cm. This prompted CT.   CT A/P w contrast 04/07/23 - uterus visualized. - bilateral cystic adnexal masses measuring 11.6 x 11.9 cm with moderately increased attenuation. Attenuation suggestive of blood products concerning for low grade cystic neoplasm.  - possible enlarging hemangioma in T12 - minimal right hydronephrosis - aortic atherosclerosis  CA125 = 85.8 HE4 64.7   She continues to have urinary incontinence and retention.  In addition she is symptomatic with this mass with abdominal discomfort.  It sounds like this discomfort has been present for quite some time and she is very stoic.    Problem List: Patient Active Problem List   Diagnosis Date Noted   Mixed stress and urge urinary incontinence 12/26/2021   FH: colon cancer in first degree relative <67 years old 02/11/2017   Mass of breast, right 02/11/2017   Hypercalcemia 10/26/2013   Vitamin D deficiency 10/26/2013   Allergic rhinitis 06/04/2013   Benign hypertension 06/04/2013   Depression 06/04/2013   Generalized anxiety disorder 06/04/2013   Hyperlipidemia 06/04/2013   Malignant neoplasm of thyroid gland (HCC) 06/04/2013   OSA on CPAP 06/04/2013    Past Medical History: Past Medical History:  Diagnosis Date   Anxiety    Cancer (HCC)    thyroid ca   Depression     Hyperlipidemia    Hypothyroid    Vitamin D deficiency     Past Surgical History: Past Surgical History:  Procedure Laterality Date   GYNECOLOGIC CRYOSURGERY  1984   LUMBAR DISC SURGERY  2004   REDUCTION MAMMAPLASTY Bilateral 1994   THYROIDECTOMY  2009   UMBILICAL HERNIA REPAIR  2011    Past Gynecologic History:  Menarch: age 89.  Post menopausal- age 89 Denies hx of STIs Endorses history of abnormal pap smear Sexually active.  No current HRT.  H/o cryosurgery; recent Pap/HPV NILM/HRHPV negative 12/28/2022 Mammogram 08/12/2022 BI-RADS CATEGORY  2: Benign    OB History:  G1P1 OB History  No obstetric history on file.   Family History: Family History  Problem Relation Age of Onset   Cancer Brother        over 5 lung cancer smoker   Cancer Brother        over 71 diagnosed with bladder and colon cancer. Died of other causes   Breast cancer Paternal Grandmother    Social History: Social History   Socioeconomic History   Marital status: Married    Spouse name: Not on file   Number of children: Not on file   Years of education: Not on file   Highest education level: Not on file  Occupational History   Not on file  Tobacco Use   Smoking status: Never   Smokeless tobacco: Never  Vaping Use   Vaping Use: Never used  Substance and Sexual Activity   Alcohol use: Yes    Comment: rare  Drug use: Never   Sexual activity: Yes    Birth control/protection: Post-menopausal  Other Topics Concern   Not on file  Social History Narrative   Not on file   Social Determinants of Health   Financial Resource Strain: Not on file  Food Insecurity: Not on file  Transportation Needs: Not on file  Physical Activity: Not on file  Stress: Not on file  Social Connections: Not on file  Intimate Partner Violence: Not on file  Retired. Married.   Allergies: Allergies  Allergen Reactions   Amoxicillin-Pot Clavulanate Other (See Comments)    Got yeast infection in the past   With this medication - not a true allergy.   Got yeast infection in the past  With this medication - not a true allergy.    Current Medications: Current Outpatient Medications  Medication Sig Dispense Refill   amLODipine (NORVASC) 2.5 MG tablet Take 2.5 mg by mouth daily.     atorvastatin (LIPITOR) 20 MG tablet Take 20 mg by mouth daily.     levocetirizine (XYZAL) 5 MG tablet Take 5 mg by mouth every evening.     levothyroxine (SYNTHROID) 112 MCG tablet Take 112 mcg by mouth daily.     mirabegron ER (MYRBETRIQ) 50 MG TB24 tablet Take 1 tablet (50 mg total) by mouth daily. 30 tablet 11   No current facility-administered medications for this visit.   Review of Systems:  General: negative for fevers, changes in weight or night sweats Skin: negative for changes in moles or sores or rash Eyes: negative for changes in vision HEENT: negative for change in hearing, tinnitus, voice changes Pulmonary: negative for dyspnea, orthopnea, productive cough, wheezing Cardiac: negative for palpitations, pain Gastrointestinal: negative for nausea, vomiting, constipation, diarrhea, hematemesis, hematochezia Genitourinary/Sexual: per HPI Ob/Gyn:  negative for abnormal bleeding, or pain Musculoskeletal: negative for pain, joint pain, back pain Hematology: negative for easy bruising, abnormal bleeding Neurologic/Psych: negative for headaches, seizures, paralysis, weakness, numbness   Objective:  Physical Examination:  BP 125/86   Pulse 67   Temp (!) 96.6 F (35.9 C)   Resp 18   Wt 153 lb (69.4 kg)   BMI 25.46 kg/m    ECOG Performance Status: 1 - Symptomatic but completely ambulatory  GENERAL: Patient is a well appearing female in no acute distress HEENT:  Sclerae anicteric.  Oropharynx clear and moist. No ulcerations or evidence of oropharyngeal candidiasis. Neck is supple.  NODES:  No cervical, supraclavicular, or axillary lymphadenopathy palpated.  LUNGS:  Clear to auscultation bilaterally.    HEART:  Regular rate and rhythm.  ABDOMEN:  Soft, nontender. No ascites or significant distension. Positive for palpable pelvic abdominal mass extending slightly above the umbilicus on the left. Limited mobility. No hernias.  MSK:  No focal spinal tenderness to palpation.  EXTREMITIES:  No peripheral edema.   SKIN:  Clear with no obvious rashes or skin changes. No nail dyscrasia. NEURO:  Nonfocal. Well oriented.  Appropriate affect.  Pelvic: Exam Chaperoned by CMA EGBUS: no lesions Cervix: Unable to visualize. The mass is pushing the cervix posteriorly. On palpation no lesions, nontender, mobile Vagina: no lesions, no discharge or bleeding Uterus: Unable to determine size due to the mass.  Adnexa: Palpable masses bilaterally and in the culdesac. Smooth to palpation. While limited mobility the mass(es) are not fixed.  Rectovaginal: confirmatory with no obvious involvement of the rectosigmoid.   Lab Review CBC/CMP pending  Radiologic Imaging: 04/07/2023 CT A/P  CLINICAL DATA:  Multiloculated cystic mass, hydronephrosis.  EXAM: CT ABDOMEN AND PELVIS WITH CONTRAST   TECHNIQUE: Multidetector CT imaging of the abdomen and pelvis was performed using the standard protocol following bolus administration of intravenous contrast.   RADIATION DOSE REDUCTION: This exam was performed according to the departmental dose-optimization program which includes automated exposure control, adjustment of the mA and/or kV according to patient size and/or use of iterative reconstruction technique.   CONTRAST:  OMNIPAQUE IOHEXOL 300 MG/ML  SOLN   COMPARISON:  Renal ultrasound 03/25/2023 and CT abdomen pelvis 07/17/2009.   FINDINGS: Lower chest: No acute findings. Heart is enlarged. No pericardial or pleural effusion. Distal esophagus is grossly unremarkable.   Hepatobiliary: Liver and gallbladder are unremarkable. No biliary ductal dilatation.   Pancreas: Negative.   Spleen:  Negative.   Adrenals/Urinary Tract: Adrenal glands and right kidney are unremarkable. 5.6 cm left renal cyst. No follow-up necessary. Mild prominence of the right ureter. Bladder is grossly unremarkable.   Stomach/Bowel: Stomach, small bowel, appendix and colon are unremarkable.   Vascular/Lymphatic: Atherosclerotic calcification of the aorta. No pathologically enlarged lymph nodes.   Reproductive: Uterus is visualized. Bilateral cystic adnexal masses measure up to 11.6 x 11.9 cm centrally, with moderately increased attenuation, 46 Hounsfield units (2/72).   Other: No free fluid.  Mesenteries and peritoneum are unremarkable.   Musculoskeletal: Degenerative changes in the spine. Slightly striated lucent lesion in the left lateral aspect the T12 vertebral body was present on 07/17/2009 but is larger.   IMPRESSION: 1. Bilateral cystic adnexal masses with the largest having intermediate attenuation, suggesting blood products. 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 Recommend GYN consult, and consider pelvis MRI w/o and w/ contrast if clinically warranted. Note: This recommendation does not apply to premenarchal patients and to those with increased risk (genetic, family history, elevated tumor markers or other high-risk factors) of ovarian cancer. Reference: JACR 2020 Feb; 17(2):248-254. 2. There may be minimal right hydronephrosis in association. 3. Possible enlarging hemangioma in the T12 vertebral body. Please correlate clinically as metastatic disease cannot be definitively excluded. 4.  Aortic atherosclerosis (ICD10-I70.0).          Assessment:  TANESSA TIDD is a 67 y.o. female diagnosed with symptomatic ~ 18 cm enlarged complex adnexal mass, elevated CA125 with left hydronephrosis in the setting of prior history of early-stage thyroid cancer status post thyroidectomy.   Hypothyroidism  Family history of malignancy.   Medical co-morbidities complicating  care: prior abdominal surgery. Prior umbilical hernia repair. H/o thyroid cancer  Plan:   Problem List Items Addressed This Visit   None Visit Diagnoses     Pelvic mass    -  Primary   Relevant Orders   CBC with Differential/Platelet (Completed)   Comprehensive metabolic panel (Completed)   EKG 12-Lead   DG Chest 2 View   Hydronephrosis, unspecified hydronephrosis type       Relevant Orders   NM Renal Imaging Flow W/Pharm   CBC with Differential/Platelet (Completed)   Comprehensive metabolic panel (Completed)   EKG 12-Lead   DG Chest 2 View      I reviewed the differential diagnosis with her including an ovarian malignancy, benign ovarian tumor versus metastatic thyroid cancer. I think metastatic thyroid cancer is very unlikely given the length of her initial diagnosis and early stage.  Given that she is symptomatic there is a role of surgery both for therapeutic and diagnostic purposes.  Given concern for possible malignancy I recommended bilateral salpingo-oophorectomy with total hysterectomy.   We discussed options for  management and recommended surgery at [facility]. Discussed with Alyson Ingles and offered two OR dates either 05/03/2023 or 05/19/2023. She would like to go with the sooner OR date with Dr. Johnnette Litter.   Have ordered additional labs today including a CBC, CMP, and TSH.  EKG and chest x-ray ordered for preoperative evaluation and assess for cardiac related issues and pulmonary metastases.  Renal Lasix scan ordered to assess left-sided kidney function.   Defer ultimate surgical plan to Dr. Johnnette Litter.  I reviewed with her the options of hand-assisted laparoscopy, controlled drainage with a minimally invasive approach possible need for ureteral transection and anastomosis versus reimplantation.  Suggested return to clinic in  4 weeks after surgery.    The patient's diagnosis, an outline of the further diagnostic and laboratory studies which will be required, the recommendation,  and alternatives were discussed.  All questions were answered to the patient's satisfaction.  A total of 80 minutes were spent with the patient/family today; >50% was spent in education, counseling and coordination of care for symptomatic ~ 18 cm enlarged complex adnexal mass, elevated CA125 with left hydronephrosis in the setting of prior history of early-stage thyroid cancer status post thyroidectomy.   Timberlynn Kizziah Leta Jungling, MD   CC:  Oletha Blend, MD [address] [facility] [place],  [place] [address] [phone]

## 2023-04-21 ENCOUNTER — Ambulatory Visit
Admission: RE | Admit: 2023-04-21 | Discharge: 2023-04-21 | Disposition: A | Discharge status: Home / Self Care | Payer: Medicare PPO | Source: Ambulatory Visit | Attending: Nurse Practitioner | Admitting: Nurse Practitioner

## 2023-04-21 DIAGNOSIS — N1330 Unspecified hydronephrosis: Secondary | ICD-10-CM | POA: Diagnosis present

## 2023-04-21 MED ORDER — TECHNETIUM TC 99M MERTIATIDE
5.0000 | Freq: Once | INTRAVENOUS | Status: AC | PRN
  Administered 2023-04-21: 5.31 via INTRAVENOUS

## 2023-04-21 MED ORDER — FUROSEMIDE 10 MG/ML IJ SOLN
34.8000 mg | INTRAMUSCULAR | Status: DC
  Filled 2023-04-21: qty 3.5

## 2023-06-08 ENCOUNTER — Inpatient Hospital Stay: Payer: Medicare PPO | Attending: Obstetrics and Gynecology | Admitting: Obstetrics and Gynecology

## 2023-06-08 VITALS — BP 110/73 | HR 75 | Temp 96.8°F | Resp 19 | Wt 150.4 lb

## 2023-06-08 DIAGNOSIS — D3910 Neoplasm of uncertain behavior of unspecified ovary: Secondary | ICD-10-CM

## 2023-06-08 DIAGNOSIS — Z90722 Acquired absence of ovaries, bilateral: Secondary | ICD-10-CM

## 2023-06-08 DIAGNOSIS — Z90710 Acquired absence of both cervix and uterus: Secondary | ICD-10-CM

## 2023-06-08 NOTE — Progress Notes (Signed)
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 Subjective:  Marissa Sullivan is a 67 y.o. female G1P1 with a history of thyroid cancer s/p thyroidectomy who was initially seen in consultation from Dr. Apolinar Junes for symptomatic pelvic mass with left-sided hydronephrosis, now s/p EUA, TLH-BSO, proctoscopy, repair of sigmoid serosa, and partial omentectomy with Dr Johnnette Litter at [facility] on 05/03/23, who returns to clinic for post op.   Intraoperative Findings: Large 18 cm bilobed cystic mass arising from the left ovary.  Frozen pathology with serous cystadenoma vs borderline tumor. Right 3 cm paraovarian cyst. No evidence of metastatic disease in the peritoneal cavity.  Prominent left external iliac lymph node, but within normal limits. Normal bowel, omentum, liver, gallbladder, stomach.    With gentle manipulation of the mass, the smaller part incidentally ruptured with spillage of clear serous fluid, which was immediately suctioned, so washings were not done.   Pathology:  A. 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 No invasion identified. See synoptic report.   Left fallopian tube with non-invasive serous borderline tumor implant.  B. Uterus with cervix and right fallopian tube and ovary, hysterectomy with right salpingo-oophorectomy: Uterus (118 grams): Endometrium:  Disordered weakly proliferative endometrium, and three endometrial polyps (largest 3.0 cm). 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 Cervix:  No pathologic diagnosis. Serosa:  Multifocal noninvasive serous borderline tumor implants. Right fallopian tube: Noninvasive serous borderline tumor implants. Right ovary: Endometriotic cyst (3.6 cm), with noninvasive serous borderline tumor implants. C. Omentum, omentectomy: Adipose tissue with noninvasive serous borderline  tumor implants (microscopic, not grossly visible)  FIGO STAGE    FIGO Stage:    IIIA1(i)  Additional Findings: Endometriosis  Uncomplicated post operative course. No complaints today.  Gynecologic Oncology History:  Patient was complaining of urinary retention and overflow incontinence which prompted a renal ultrasound. She was noted to have multiloculated cystic mass in the anterior abdomen superior to bladder measuring 17.9 x 10.2 x 11.5 cm and left hydroureter 1.7 cm. She also has a renal cyst in the left upper pole measuring 4.7 x 5.1 x 4.3 cm. This prompted CT.   CT A/P w contrast 04/07/23 - uterus visualized. - bilateral cystic adnexal masses measuring 11.6 x 11.9 cm with moderately increased attenuation. Attenuation suggestive of blood products concerning for low grade cystic neoplasm.  - possible enlarging hemangioma in T12 - minimal right hydronephrosis - aortic atherosclerosis  CA125 = 85.8 HE4 64.7   She complained of urinary incontinence and retention, she was symptomatic with this mass with abdominal discomfort.  It sounds like this discomfort has been present for quite some time and she is very stoic. Mother and brother have history of DVT  04/21/23- NM Renal Imaging Flow: No hydronephrosis Normal Renal function  Pap: 12/28/22- NILM, HR HPV negative  Genetic Risk Assessment:  Molecular Tumor Testing:    Problem List: Patient Active Problem List   Diagnosis Date Noted   Mixed stress and urge urinary incontinence 12/26/2021   FH: colon cancer in first degree relative <53 years old 02/11/2017   Mass of breast, right 02/11/2017   Hypercalcemia 10/26/2013   Vitamin D deficiency 10/26/2013   Allergic rhinitis 06/04/2013   Benign hypertension 06/04/2013   Depression 06/04/2013   Generalized anxiety disorder 06/04/2013   Hyperlipidemia 06/04/2013   Malignant neoplasm of thyroid gland (HCC) 06/04/2013   OSA on CPAP 06/04/2013  Past Medical History: Past Medical  History:  Diagnosis Date   Anxiety    Cancer (HCC)    thyroid ca   Depression    Hyperlipidemia    Hypothyroid    Vitamin D deficiency     Past Surgical History: Past Surgical History:  Procedure Laterality Date   GYNECOLOGIC CRYOSURGERY  1984   LUMBAR DISC SURGERY  2004   REDUCTION MAMMAPLASTY Bilateral 1994   THYROIDECTOMY  2009   UMBILICAL HERNIA REPAIR  2011    Past Gynecologic History:  Menarch: age 76.  Post menopausal- age 89 Denies hx of STIs Endorses history of abnormal pap smear Sexually active.  No current HRT.  H/o cryosurgery; recent Pap/HPV NILM/HRHPV negative 12/28/2022 Mammogram 08/12/2022 BI-RADS CATEGORY  2: Benign    OB History:  G1P1 OB History  No obstetric history on file.   Family History: Family History  Problem Relation Age of Onset   Cancer Brother        over 40 lung cancer smoker   Cancer Brother        over 26 diagnosed with bladder and colon cancer. Died of other causes   Breast cancer Paternal Grandmother    Social History: Social History   Socioeconomic History   Marital status: Married    Spouse name: Not on file   Number of children: Not on file   Years of education: Not on file   Highest education level: Not on file  Occupational History   Not on file  Tobacco Use   Smoking status: Never   Smokeless tobacco: Never  Vaping Use   Vaping status: Never Used  Substance and Sexual Activity   Alcohol use: Yes    Comment: rare   Drug use: Never   Sexual activity: Yes    Birth control/protection: Post-menopausal  Other Topics Concern   Not on file  Social History Narrative   Not on file   Social Determinants of Health   Financial Resource Strain: Low Risk  (12/28/2022)   Received from [facility], [facility]   Overall Financial Resource Strain (CARDIA)    Difficulty of Paying Living Expenses: Not hard at all  Food Insecurity: No Food Insecurity (12/28/2022)   Received from [facility], [facility]    Hunger Vital Sign    Worried About Running Out of Food in the Last Year: Never true    Ran Out of Food in the Last Year: Never true  Transportation Needs: No Transportation Needs (12/28/2022)   Received from [facility], [facility]   PRAPARE - Transportation    Lack of Transportation (Medical): No    Lack of Transportation (Non-Medical): No  Physical Activity: Sufficiently Active (12/28/2022)   Received from [facility], [facility]   Exercise Vital Sign    Days of Exercise per Week: 7 days    Minutes of Exercise per Session: 30 min  Stress: No Stress Concern Present (12/28/2022)   Received from [facility], [facility] - Occupational Stress Questionnaire    Feeling of Stress : Not at all  Social Connections: Moderately Isolated (12/28/2022)   Received from [facility], [facility]   Social Connection and Isolation Panel [NHANES]    Frequency of Communication with Friends and Family: More than three times a week    Frequency of Social Gatherings with Friends and Family: More than three times a week  Attends Religious Services: Never    Active Member of Clubs or Organizations: No    Attends Banker Meetings: Never    Marital Status: Married  Catering manager Violence: Not At Risk (12/28/2022)   Received from [facility], [facility]   Humiliation, Afraid, Rape, and Kick questionnaire    Fear of Current or Ex-Partner: No    Emotionally Abused: No    Physically Abused: No    Sexually Abused: No  Retired. Married.   Allergies: Allergies  Allergen Reactions   Amoxicillin-Pot Clavulanate Other (See Comments)    Got yeast infection in the past  With this medication - not a true allergy.   Got yeast infection in the past  With this medication - not a true allergy.    Current Medications: Current Outpatient Medications  Medication Sig Dispense Refill   amLODipine (NORVASC) 2.5 MG  tablet Take 2.5 mg by mouth daily.     atorvastatin (LIPITOR) 20 MG tablet Take 20 mg by mouth daily.     levocetirizine (XYZAL) 5 MG tablet Take 5 mg by mouth every evening.     levothyroxine (SYNTHROID) 112 MCG tablet Take 112 mcg by mouth daily.     mirabegron ER (MYRBETRIQ) 50 MG TB24 tablet Take 1 tablet (50 mg total) by mouth daily. 30 tablet 11   No current facility-administered medications for this visit.   Review of Systems General:  no complaints Skin: no complaints Eyes: no complaints HEENT: no complaints Breasts: no complaints Pulmonary: no complaints Cardiac: no complaints Gastrointestinal: no complaints Genitourinary/Sexual: no complaints Ob/Gyn: no complaints Musculoskeletal: no complaints Hematology: no complaints Neurologic/Psych: no complaints   Objective:  Physical Examination:  BP 110/73   Pulse 75   Temp (!) 96.8 F (36 C)   Resp 19   Wt 150 lb 6.4 oz (68.2 kg)   SpO2 98%   BMI 25.03 kg/m    ECOG Performance Status: 0 - Asymptomatic  GENERAL: Patient is a well appearing female in no acute distress. Accompanied by husband. HEENT:  Sclera clear. Anicteric LUNGS:  Clear to auscultation bilaterally.   HEART:  Regular rate and rhythm.  ABDOMEN:  Soft, nontender.  No hernias, incisions well healed. No masses or ascites EXTREMITIES:  No peripheral edema. Atraumatic. No cyanosis SKIN:  Clear with no obvious rashes or skin changes.  NEURO:  Nonfocal. Well oriented.  Appropriate affect.  Pelvic: Exam Chaperoned by NP EGBUS: no lesions Vagina: no lesions, discharge, or bleeding. Vaginal cuff well healed.  Cervix, Uterus: surgically absent BME: smooth. Nontender. No palpable masses RV: Deferred  Lab Review Per hpi  Radiologic Imaging: Per hpi  Assessment:  Marissa Sullivan is a 67 y.o. female diagnosed with symptomatic ~ 18 cm enlarged complex adnexal mass, elevated CA125 85 with left hydronephrosis in the setting of prior history of early-stage  thyroid cancer status post thyroidectomy. She underwent TLH-BSO with omentectomy at [facility] with Dr Johnnette Litter on 05/03/23. 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 No adjuvant treatment recommended.   Normal post op exam.  Hypothyroidism  Family history of malignancy.   Medical co-morbidities complicating care: prior abdominal surgery. Prior umbilical hernia repair. 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  Her prognosis should be excellent with a low risk of clinical recurrence or transformation to frank cancer.  She can be followed with surveillance and monitoring of CA125, which was elevated at time of diagnosis.   We will plan to see her back in 6 months with ca 125 at that time. Should she develop concerning symptoms, we would see her back sooner.   The patient's diagnosis, an outline of the further diagnostic and laboratory studies which will be required, the recommendation, and alternatives were discussed.  All questions were answered to the patient's satisfaction.  Consuello Masse, DNP, AGNP-C, AOCNP [facility] [phone] (clinic)  I personally interviewed and examined the patient. Agreed with the above/below plan of care. I have directly contributed to assessment and plan of care of this patient and educated and discussed with patient and family.  Leida Lauth, MD  CC:  Oletha Blend, MD [address] [facility] [place],  [place] [address] [phone]  Dr Apolinar Junes- urology

## 2023-07-19 ENCOUNTER — Other Ambulatory Visit: Payer: Self-pay | Admitting: Family Medicine

## 2023-07-19 DIAGNOSIS — Z13820 Encounter for screening for osteoporosis: Secondary | ICD-10-CM

## 2023-07-19 DIAGNOSIS — Z1231 Encounter for screening mammogram for malignant neoplasm of breast: Secondary | ICD-10-CM

## 2023-07-19 DIAGNOSIS — Z780 Asymptomatic menopausal state: Secondary | ICD-10-CM

## 2023-08-31 ENCOUNTER — Ambulatory Visit
Admission: RE | Admit: 2023-08-31 | Discharge: 2023-08-31 | Disposition: A | Discharge status: Home / Self Care | Payer: Medicare PPO | Source: Ambulatory Visit | Attending: Family Medicine | Admitting: Family Medicine

## 2023-08-31 DIAGNOSIS — Z13820 Encounter for screening for osteoporosis: Secondary | ICD-10-CM | POA: Insufficient documentation

## 2023-08-31 DIAGNOSIS — Z780 Asymptomatic menopausal state: Secondary | ICD-10-CM | POA: Diagnosis present

## 2023-08-31 DIAGNOSIS — Z1231 Encounter for screening mammogram for malignant neoplasm of breast: Secondary | ICD-10-CM | POA: Diagnosis present

## 2023-10-18 ENCOUNTER — Ambulatory Visit: Payer: Medicare PPO | Admitting: Urology

## 2023-12-05 ENCOUNTER — Inpatient Hospital Stay: Payer: Medicare PPO | Attending: Oncology

## 2023-12-05 DIAGNOSIS — R971 Elevated cancer antigen 125 [CA 125]: Secondary | ICD-10-CM | POA: Insufficient documentation

## 2023-12-05 DIAGNOSIS — Z9079 Acquired absence of other genital organ(s): Secondary | ICD-10-CM | POA: Diagnosis not present

## 2023-12-05 DIAGNOSIS — Z90722 Acquired absence of ovaries, bilateral: Secondary | ICD-10-CM | POA: Insufficient documentation

## 2023-12-05 DIAGNOSIS — Z85850 Personal history of malignant neoplasm of thyroid: Secondary | ICD-10-CM | POA: Diagnosis not present

## 2023-12-05 DIAGNOSIS — Z90710 Acquired absence of both cervix and uterus: Secondary | ICD-10-CM | POA: Diagnosis not present

## 2023-12-05 DIAGNOSIS — D3910 Neoplasm of uncertain behavior of unspecified ovary: Secondary | ICD-10-CM | POA: Diagnosis present

## 2023-12-06 LAB — CA 125: Cancer Antigen (CA) 125: 9.6 U/mL (ref 0.0–38.1)

## 2023-12-07 ENCOUNTER — Inpatient Hospital Stay (HOSPITAL_BASED_OUTPATIENT_CLINIC_OR_DEPARTMENT_OTHER): Payer: Medicare PPO | Admitting: Obstetrics and Gynecology

## 2023-12-07 ENCOUNTER — Encounter: Payer: Self-pay | Admitting: Obstetrics and Gynecology

## 2023-12-07 VITALS — BP 148/78 | HR 99 | Temp 96.4°F | Resp 16 | Wt 155.0 lb

## 2023-12-07 DIAGNOSIS — D3910 Neoplasm of uncertain behavior of unspecified ovary: Secondary | ICD-10-CM

## 2023-12-07 NOTE — Progress Notes (Signed)
 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 Subjective:  Marissa Sullivan is a 68 y.o. female G1P1 with a history of thyroid cancer s/p thyroidectomy who was initially seen in consultation from Dr. Apolinar Junes for symptomatic pelvic mass with left-sided hydronephrosis, now s/p EUA, TLH-BSO, proctoscopy, repair of sigmoid serosa, and partial omentectomy with Dr Johnnette Litter at [facility] on 05/03/23. 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  No new complaints CA 125  12/05/23- 9.6  Gynecologic Oncology History:  Patient was complaining of urinary retention and overflow incontinence which prompted a renal ultrasound. She was noted to have multiloculated cystic mass in the anterior abdomen superior to bladder measuring 17.9 x 10.2 x 11.5 cm and left hydroureter 1.7 cm. She also has a renal cyst in the left upper pole measuring 4.7 x 5.1 x 4.3 cm. This prompted CT.   CT A/P w contrast 04/07/23 - uterus visualized. - bilateral cystic adnexal masses measuring 11.6 x 11.9 cm with moderately increased attenuation. Attenuation suggestive of blood products concerning for low grade cystic neoplasm.  - possible enlarging hemangioma in T12 - minimal right hydronephrosis - aortic atherosclerosis  CA125 = 85.8 HE4 64.7   She complained of urinary incontinence and retention, she was symptomatic with this mass with abdominal discomfort.  It sounded like this discomfort has been present for quite some time and she is very stoic. Mother and brother have history of DVT  04/21/23- [facility] Renal Imaging Flow: No hydronephrosis Normal Renal function  Pap: 12/28/22- NILM, HR HPV negative  05/03/23 Surgery at [facility] Intraoperative Findings: Large 18 cm bilobed cystic mass arising from the left ovary.  Frozen  pathology with serous cystadenoma vs borderline tumor. Right 3 cm paraovarian cyst. No evidence of metastatic disease in the peritoneal cavity.  Prominent left external iliac lymph node, but within normal limits. Normal bowel, omentum, liver, gallbladder, stomach.    With gentle manipulation of the mass, the smaller part incidentally ruptured with spillage of clear serous fluid, which was immediately suctioned, so washings were not done.   Pathology:  A. 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 No invasion identified. See synoptic report.   Left fallopian tube with non-invasive serous borderline tumor implant.  B. Uterus with cervix and right fallopian tube and ovary, hysterectomy with right salpingo-oophorectomy: Uterus (118 grams): Endometrium:  Disordered weakly proliferative endometrium, and three endometrial polyps (largest 3.0 cm). 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 Cervix:  No pathologic diagnosis. Serosa:  Multifocal noninvasive serous borderline tumor implants. Right fallopian tube: Noninvasive serous borderline tumor implants. Right ovary: Endometriotic cyst (3.6 cm), with noninvasive serous borderline tumor implants. C. Omentum, omentectomy: Adipose tissue with noninvasive serous borderline tumor implants (microscopic, not grossly visible)  FIGO STAGE    FIGO Stage:    IIIA1(i)  Additional Findings: Endometriosis  Uncomplicated post operative course.   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 06/08/2023   Mixed stress and urge urinary incontinence 12/26/2021   FH: colon cancer in first degree relative <17 years old 02/11/2017   Mass of breast, right 02/11/2017   Hypercalcemia 10/26/2013   Vitamin D deficiency 10/26/2013  Allergic rhinitis 06/04/2013   Benign hypertension 06/04/2013   Depression 06/04/2013   Generalized anxiety disorder 06/04/2013   Hyperlipidemia 06/04/2013   Malignant neoplasm of thyroid gland (HCC) 06/04/2013   OSA on CPAP 06/04/2013    Past Medical History: Past Medical History:  Diagnosis Date   Anxiety    Cancer (HCC)    thyroid ca   Depression    Hyperlipidemia    Hypothyroid    Vitamin D deficiency     Past Surgical History: Past Surgical History:  Procedure Laterality Date   GYNECOLOGIC CRYOSURGERY  1984   LUMBAR DISC SURGERY  2004   REDUCTION MAMMAPLASTY Bilateral 1994   THYROIDECTOMY  2009   UMBILICAL HERNIA REPAIR  2011    Past Gynecologic History:  Menarch: age 68.  Post menopausal- age 30 Denies hx of STIs Endorses history of abnormal pap smear Sexually active.  No current HRT.  H/o cryosurgery; recent Pap/HPV NILM/HRHPV negative 12/28/2022 Mammogram 08/12/2022 BI-RADS CATEGORY  2: Benign    OB History:  G1P1 OB History  No obstetric history on file.   Family History: Family History  Problem Relation Age of Onset   Cancer Brother        over 64 lung cancer smoker   Cancer Brother        over 15 diagnosed with bladder and colon cancer. Died of other causes   Breast cancer Paternal Grandmother    Social History: Social History   Socioeconomic History   Marital status: Married    Spouse name: Not on file   Number of children: Not on file   Years of education: Not on file   Highest education level: Not on file  Occupational History   Not on file  Tobacco Use   Smoking status: Never   Smokeless tobacco: Never  Vaping Use   Vaping status: Never Used  Substance and Sexual Activity   Alcohol use: Yes    Comment: rare   Drug use: Never   Sexual activity: Yes    Birth control/protection: Post-menopausal  Other Topics Concern   Not on file  Social History Narrative   Not on file   Social Drivers of Health   Financial Resource  Strain: Low Risk  (12/28/2022)   Received from [facility], [facility]   Overall Financial Resource Strain (CARDIA)    Difficulty of Paying Living Expenses: Not hard at all  Food Insecurity: No Food Insecurity (07/01/2023)   Received from [facility]   Hunger Vital Sign    Worried About Running Out of Food in the Last Year: Never true    Ran Out of Food in the Last Year: Never true  Transportation Needs: No Transportation Needs (12/28/2022)   Received from [facility], [facility]   PRAPARE - Transportation    Lack of Transportation (Medical): No    Lack of Transportation (Non-Medical): No  Physical Activity: Sufficiently Active (12/28/2022)   Received from [facility], [facility]   Exercise Vital Sign    Days of Exercise per Week: 7 days    Minutes of Exercise per Session: 30 min  Stress: No Stress Concern Present (12/28/2022)   Received from [facility], [facility]ational [facility] - Occupational Stress Questionnaire    Feeling of Stress : Not at all  Social Connections: Moderately Isolated (12/28/2022)   Received from [facility], [facility]   Social Connection and Isolation  Panel [NHANES]    Frequency of Communication with Friends and Family: More than three times a week    Frequency of Social Gatherings with Friends and Family: More than three times a week    Attends Religious Services: Never    Database administrator or Organizations: No    Attends Banker Meetings: Never    Marital Status: Married  Catering manager Violence: Not At Risk (12/28/2022)   Received from [facility], [facility]   Humiliation, Afraid, Rape, and Kick questionnaire    Fear of Current or Ex-Partner: No    Emotionally Abused: No    Physically Abused: No    Sexually Abused: No  Retired. Married.   Allergies: Allergies  Allergen Reactions   Amoxicillin-Pot Clavulanate Other (See Comments)    Got yeast  infection in the past  With this medication - not a true allergy.   Got yeast infection in the past  With this medication - not a true allergy.    Current Medications: Current Outpatient Medications  Medication Sig Dispense Refill   amLODipine (NORVASC) 2.5 MG tablet Take 2.5 mg by mouth daily.     atorvastatin (LIPITOR) 20 MG tablet Take 20 mg by mouth daily.     levocetirizine (XYZAL) 5 MG tablet Take 5 mg by mouth every evening.     levothyroxine (SYNTHROID) 112 MCG tablet Take 112 mcg by mouth daily.     mirabegron ER (MYRBETRIQ) 50 MG TB24 tablet Take 1 tablet (50 mg total) by mouth daily. 30 tablet 11   No current facility-administered medications for this visit.   Review of Systems General:  no complaints Skin: no complaints Eyes: no complaints HEENT: no complaints Breasts: no complaints Pulmonary: no complaints Cardiac: no complaints Gastrointestinal: no complaints Genitourinary/Sexual: no complaints Ob/Gyn: no complaints Musculoskeletal: no complaints Hematology: no complaints Neurologic/Psych: no complaints   Objective:  Physical Examination:  BP (!) 148/78 (BP Location: Right Arm, Patient Position: Sitting, Cuff Size: Normal)   Pulse 99   Temp (!) 96.4 F (35.8 C) (Tympanic)   Resp 16   Wt 155 lb (70.3 kg)   SpO2 99%   BMI 25.79 kg/m    ECOG Performance Status: 0 - Asymptomatic  GENERAL: Patient is a well appearing female in no acute distress. Accompanied by husband. HEENT:  Sclera clear. Anicteric LUNGS:  Clear to auscultation bilaterally.   HEART:  Regular rate and rhythm.  ABDOMEN:  Soft, nontender.  No hernias, incisions well healed. No masses or ascites EXTREMITIES:  No peripheral edema. Atraumatic. No cyanosis SKIN:  Clear with no obvious rashes or skin changes.  NEURO:  Nonfocal. Well oriented.  Appropriate affect.  Pelvic: Exam Chaperoned by CMA EGBUS: no lesions Vagina: no lesions, discharge, or bleeding. Vaginal cuff well healed.  Cervix,  Uterus: surgically absent BME: smooth. Nontender. No palpable masses RV: Deferred  Lab Review Per HPI  Radiologic Imaging: No imaging on site today  Assessment:  Marissa Sullivan is a 68 y.o. female diagnosed with symptomatic ~ 18 cm enlarged complex adnexal mass, elevated CA125 85 with left hydronephrosis in the setting of prior history of early-stage thyroid cancer status post thyroidectomy. She underwent TLH-BSO with omentectomy at [facility] with Dr Johnnette Litter on 05/03/23. Pathology consistent with stage IIIA1i serous borderline tumor with non-invasive implants on fallopian tubes and omentum. No adjuvant treatment recommended. CA 125 has normalized to 9.6. Clinically asymptomatic.   Hypothyroidism  Family history of malignancy.   Medical co-morbidities complicating care:  prior abdominal surgery. Prior umbilical hernia repair. 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 CA 125 has normalized to 9.6, initially 85.8. She is clinically asymptomatic. Her prognosis should be excellent with low risk of clinical recurrence or transformation to malignancy. We will continue to follow her for pelvic exams and CA125 every 6 months. We reviewed symptoms that would be concerning for transformation today including bleeding, pain, discharge, bloating, pelvic pain, abdominal distention, changes in bowel or bladder habits, unintentional weight loss. Should she experience these symptoms, we would see her back sooner.   The patient's diagnosis, an outline of the further diagnostic and laboratory studies which will be required, the recommendation, and alternatives were discussed.  All questions were answered to the patient's satisfaction.  Consuello Masse, DNP, AGNP-C, AOCNP [facility] [phone] (clinic)  I personally interviewed and examined the  patient. Agreed with the above/below plan of care. I have directly contributed to assessment and plan of care of this patient and educated and discussed with patient and family.  Leida Lauth, MD  CC:  Oletha Blend, MD [address] [facility] [place],  [place] [address] [phone]

## 2024-06-04 ENCOUNTER — Inpatient Hospital Stay: Payer: Medicare PPO | Attending: Obstetrics and Gynecology

## 2024-06-04 DIAGNOSIS — Z90710 Acquired absence of both cervix and uterus: Secondary | ICD-10-CM | POA: Diagnosis not present

## 2024-06-04 DIAGNOSIS — D3910 Neoplasm of uncertain behavior of unspecified ovary: Secondary | ICD-10-CM

## 2024-06-04 DIAGNOSIS — Z9079 Acquired absence of other genital organ(s): Secondary | ICD-10-CM | POA: Insufficient documentation

## 2024-06-04 DIAGNOSIS — Z90722 Acquired absence of ovaries, bilateral: Secondary | ICD-10-CM | POA: Diagnosis not present

## 2024-06-04 DIAGNOSIS — R971 Elevated cancer antigen 125 [CA 125]: Secondary | ICD-10-CM | POA: Diagnosis not present

## 2024-06-04 DIAGNOSIS — Z8603 Personal history of neoplasm of uncertain behavior: Secondary | ICD-10-CM | POA: Diagnosis present

## 2024-06-05 LAB — CA 125: Cancer Antigen (CA) 125: 10.6 U/mL (ref 0.0–38.1)

## 2024-06-06 ENCOUNTER — Inpatient Hospital Stay: Payer: Medicare PPO | Admitting: Obstetrics and Gynecology

## 2024-06-06 ENCOUNTER — Encounter: Payer: Self-pay | Admitting: Obstetrics and Gynecology

## 2024-06-06 VITALS — BP 130/78 | HR 72 | Temp 98.6°F | Resp 20 | Wt 155.6 lb

## 2024-06-06 DIAGNOSIS — Z8603 Personal history of neoplasm of uncertain behavior: Secondary | ICD-10-CM | POA: Diagnosis not present

## 2024-06-06 DIAGNOSIS — D3910 Neoplasm of uncertain behavior of unspecified ovary: Secondary | ICD-10-CM | POA: Diagnosis not present

## 2024-06-06 NOTE — Progress Notes (Signed)
 Gynecologic Oncology Interval Visit   Referring Provider: Dr Penne  Chief Concern: Serous borderline tumor with non-invasive implants. Subjective:  Marissa Sullivan is a 68 y.o. female G1P1 with a history of thyroid  cancer s/p thyroidectomy who was initially seen in consultation from Dr. Penne for symptomatic pelvic mass with left-sided hydronephrosis, now s/p EUA, TLH-BSO, proctoscopy, repair of sigmoid serosa, and partial omentectomy with Dr Mancil at [facility] on 05/03/23. 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  No new complaints CA 125 has been followed 1 year ago (diagnosis) 85.8 12/05/23- 9.6 06/04/24- 10.6  Gynecologic Oncology History:  Patient was complaining of urinary retention and overflow incontinence which prompted a renal ultrasound. She was noted to have multiloculated cystic mass in the anterior abdomen superior to bladder measuring 17.9 x 10.2 x 11.5 cm and left hydroureter 1.7 cm. She also has a renal cyst in the left upper pole measuring 4.7 x 5.1 x 4.3 cm. This prompted CT.   CT A/P w contrast 04/07/23 - uterus visualized. - bilateral cystic adnexal masses measuring 11.6 x 11.9 cm with moderately increased attenuation. Attenuation suggestive of blood products concerning for low grade cystic neoplasm.  - possible enlarging hemangioma in T12 - minimal right hydronephrosis - aortic atherosclerosis  CA125 = 85.8 HE4 64.7   She complained of urinary incontinence and retention, she was symptomatic with this mass with abdominal discomfort.  It sounded like this discomfort has been present for quite some time and she is very stoic. Mother and brother have history of DVT  04/21/23- [facility] Renal Imaging Flow: No hydronephrosis Normal Renal function  Pap: 12/28/22- NILM, HR HPV negative  05/03/23 Surgery at [facility] Intraoperative Findings: Large  18 cm bilobed cystic mass arising from the left ovary.  Frozen pathology with serous cystadenoma vs borderline tumor. Right 3 cm paraovarian cyst. No evidence of metastatic disease in the peritoneal cavity.  Prominent left external iliac lymph node, but within normal limits. Normal bowel, omentum, liver, gallbladder, stomach.    With gentle manipulation of the mass, the smaller part incidentally ruptured with spillage of clear serous fluid, which was immediately suctioned, so washings were not done.   Pathology:  A. 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 No invasion identified. See synoptic report.   Left fallopian tube with non-invasive serous borderline tumor implant.  B. Uterus with cervix and right fallopian tube and ovary, hysterectomy with right salpingo-oophorectomy: Uterus (118 grams): Endometrium:  Disordered weakly proliferative endometrium, and three endometrial polyps (largest 3.0 cm). 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 Cervix:  No pathologic diagnosis. Serosa:  Multifocal noninvasive serous borderline tumor implants. Right fallopian tube: Noninvasive serous borderline tumor implants. Right ovary: Endometriotic cyst (3.6 cm), with noninvasive serous borderline tumor implants. C. Omentum, omentectomy: Adipose tissue with noninvasive serous borderline tumor implants (microscopic, not grossly visible)  FIGO STAGE    FIGO Stage:    IIIA1(i)  Additional Findings: Endometriosis  Uncomplicated post operative course.   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 06/08/2023   Mixed stress and urge urinary incontinence 12/26/2021   FH: colon cancer in first degree relative <78 years old 02/11/2017   Mass of breast, right 02/11/2017  Hypercalcemia 10/26/2013   Vitamin D deficiency 10/26/2013   Allergic rhinitis 06/04/2013   Benign hypertension 06/04/2013   Depression 06/04/2013   Generalized anxiety disorder 06/04/2013   Hyperlipidemia 06/04/2013   Malignant neoplasm of thyroid  gland (HCC) 06/04/2013   OSA on CPAP 06/04/2013    Past Medical History: Past Medical History:  Diagnosis Date   Anxiety    Cancer (HCC)    thyroid  ca   Depression    Hyperlipidemia    Hypothyroid    Vitamin D deficiency     Past Surgical History: Past Surgical History:  Procedure Laterality Date   GYNECOLOGIC CRYOSURGERY  1984   LUMBAR DISC SURGERY  2004   REDUCTION MAMMAPLASTY Bilateral 1994   THYROIDECTOMY  2009   UMBILICAL HERNIA REPAIR  2011    Past Gynecologic History:  Menarch: age 70.  Post menopausal- age 56 Denies hx of STIs Endorses history of abnormal pap smear Sexually active.  No current HRT.  H/o cryosurgery; recent Pap/HPV NILM/HRHPV negative 12/28/2022 Mammogram 08/12/2022 BI-RADS CATEGORY  2: Benign    OB History:  G1P1 OB History  No obstetric history on file.   Family History: Family History  Problem Relation Age of Onset   Cancer Brother        over 27 lung cancer smoker   Cancer Brother        over 21 diagnosed with bladder and colon cancer. Died of other causes   Breast cancer Paternal Grandmother    Social History: Social History   Socioeconomic History   Marital status: Married    Spouse name: Not on file   Number of children: Not on file   Years of education: Not on file   Highest education level: Not on file  Occupational History   Not on file  Tobacco Use   Smoking status: Never   Smokeless tobacco: Never  Vaping Use   Vaping status: Never Used  Substance and Sexual Activity   Alcohol use: Yes    Comment: rare   Drug use: Never   Sexual activity: Yes    Birth control/protection: Post-menopausal  Other Topics Concern   Not on file  Social History Narrative   Not on  file   Social Drivers of Health   Financial Resource Strain: Low Risk  (12/28/2022)   Received from [facility]   Overall Financial Resource Strain (CARDIA)    Difficulty of Paying Living Expenses: Not hard at all  Food Insecurity: No Food Insecurity (12/30/2023)   Received from [facility]   Hunger Vital Sign    Within the past 12 months, you worried that your food would run out before you got the money to buy more.: Never true    Within the past 12 months, the food you bought just didn't last and you didn't have money to get more.: Never true  Transportation Needs: No Transportation Needs (12/30/2023)   Received from [facility]   PRAPARE - Transportation    Lack of Transportation (Medical): No    Lack of Transportation (Non-Medical): No  Physical Activity: Sufficiently Active (12/28/2022)   Received from [facility]   Exercise Vital Sign    On average, how many days per week do you engage in moderate to strenuous exercise (like a brisk walk)?: 7 days    On average, how many minutes do you engage in exercise at this level?: 30 min  Stress: No Stress Concern Present (12/28/2022)   Received from [facility]  [employer] of Occupational Health - Occupational Stress Questionnaire    Feeling of Stress : Not at all  Social Connections: Moderately Isolated (12/28/2022)   Received from [facility]   Social Connection and Isolation Panel    In a typical week, how many times do you talk on the phone with family, friends, or neighbors?: More than three times a week    How often do you get together with friends or relatives?: More than three times a week    How often do you attend church or religious services?: Never    Do you belong to any clubs or organizations such as church groups, unions, fraternal or athletic groups, or school groups?: No    How often do you attend meetings of the clubs or organizations you belong to?: Never    Are you married, widowed,  divorced, separated, never married, or living with a partner?: Married  Intimate Partner Violence: Not At Risk (12/30/2023)   Received from [facility]   Humiliation, Afraid, Rape, and Kick questionnaire    Within the last year, have you been afraid of your partner or ex-partner?: No    Within the last year, have you been humiliated or emotionally abused in other ways by your partner or ex-partner?: No    Within the last year, have you been kicked, hit, slapped, or otherwise physically hurt by your partner or ex-partner?: No    Within the last year, have you been raped or forced to have any kind of sexual activity by your partner or ex-partner?: No  Retired. Married.   Allergies: Allergies  Allergen Reactions   Amoxicillin-Pot Clavulanate Other (See Comments)    Got yeast infection in the past  With this medication - not a true allergy.   Got yeast infection in the past  With this medication - not a true allergy.    Current Medications: Current Outpatient Medications  Medication Sig Dispense Refill   amLODipine (NORVASC) 2.5 MG tablet Take 2.5 mg by mouth daily.     atorvastatin (LIPITOR) 20 MG tablet Take 20 mg by mouth daily.     levocetirizine (XYZAL) 5 MG tablet Take 5 mg by mouth every evening.     levothyroxine (SYNTHROID) 112 MCG tablet Take 112 mcg by mouth daily.     mirabegron  ER (MYRBETRIQ ) 50 MG TB24 tablet Take 1 tablet (50 mg total) by mouth daily. 30 tablet 11   No current facility-administered medications for this visit.   Review of Systems General:  no complaints Skin: no complaints Eyes: no complaints HEENT: no complaints Breasts: no complaints Pulmonary: no complaints Cardiac: no complaints Gastrointestinal: no complaints Genitourinary/Sexual: no complaints Ob/Gyn: no complaints Musculoskeletal: no complaints Hematology: no complaints Neurologic/Psych: no complaints   Objective:  Physical Examination:  BP 130/78   Pulse 72   Temp 98.6 F (37 C)    Resp 20   Wt 155 lb 9.6 oz (70.6 kg)   SpO2 100%   BMI 25.89 kg/m    ECOG Performance Status: 0 - Asymptomatic  GENERAL: Patient is a well appearing female in no acute distress. Accompanied by husband. HEENT:  Sclera clear. Anicteric LUNGS:  Clear to auscultation bilaterally.   HEART:  Regular rate and rhythm.  ABDOMEN:  Soft, nontender.  No hernias, incisions well healed. No masses or ascites EXTREMITIES:  No peripheral edema. Atraumatic. No cyanosis SKIN:  Clear with no obvious rashes or skin changes.  NEURO:  Nonfocal. Well oriented.  Appropriate affect.  Pelvic: Exam Chaperoned by CMA EGBUS: no lesions Vagina: no lesions, discharge, or bleeding. Vaginal cuff well healed.  Cervix, Uterus: surgically absent BME: smooth. Nontender. No palpable masses RV: Deferred  Lab Review Per HPI  Radiologic Imaging: No imaging on site today  Assessment:  LIRIO BACH is a 68 y.o. female diagnosed with symptomatic ~ 18 cm enlarged complex adnexal mass, elevated CA125 85 with left hydronephrosis in the setting of prior history of early-stage thyroid  cancer status post thyroidectomy. She underwent TLH-BSO with omentectomy at [facility] with Dr Mancil on 05/03/23. Pathology consistent with stage IIIA1i serous borderline tumor with non-invasive implants on fallopian tubes and omentum. No adjuvant treatment recommended. CA 125 has normalized. Clinically asymptomatic.  CA125 10 today.  Hypothyroidism  Family history of malignancy.   Medical co-morbidities complicating care: prior abdominal surgery. Prior umbilical hernia repair. 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 CA 125 has normalized to 10, initially 85.8. She is clinically asymptomatic. Her prognosis should be excellent with low risk of clinical recurrence or  transformation to malignancy. We will continue to follow her for pelvic exams and CA125 every 6 months. We reviewed symptoms that would be concerning for transformation today including bleeding, pain, discharge, bloating, pelvic pain, abdominal distention, changes in bowel or bladder habits, unintentional weight loss. Should she experience these symptoms, we would see her back sooner.   The patient's diagnosis, an outline of the further diagnostic and laboratory studies which will be required, the recommendation, and alternatives were discussed.  All questions were answered to the patient's satisfaction.  Tinnie Dawn, DNP, AGNP-C, AOCNP [facility] [phone] (clinic)  I personally interviewed and examined the patient. Agreed with the above/below plan of care. I have directly contributed to assessment and plan of care of this patient and educated and discussed with patient and family.  Prentice Mancil, MD  CC:  Waylan Tinnie, MD [address] [facility] [place],  [place] [id number] [phone]

## 2024-07-10 ENCOUNTER — Other Ambulatory Visit: Payer: Self-pay | Admitting: Family Medicine

## 2024-07-10 DIAGNOSIS — Z1231 Encounter for screening mammogram for malignant neoplasm of breast: Secondary | ICD-10-CM

## 2024-09-19 ENCOUNTER — Ambulatory Visit
Admission: RE | Admit: 2024-09-19 | Discharge: 2024-09-19 | Disposition: A | Discharge status: Home / Self Care | Source: Ambulatory Visit | Attending: Family Medicine | Admitting: Family Medicine

## 2024-09-19 DIAGNOSIS — Z1231 Encounter for screening mammogram for malignant neoplasm of breast: Secondary | ICD-10-CM | POA: Diagnosis present

## 2024-12-03 ENCOUNTER — Other Ambulatory Visit

## 2024-12-05 ENCOUNTER — Ambulatory Visit
